# Patient Record
Sex: Male | Born: 1935 | Race: White | Hispanic: No | Smoking: Former smoker
Health system: Southern US, Community
[De-identification: ages and names within clinical notes are randomized; demographics above are authoritative.]

## PROBLEM LIST (undated history)

## (undated) DIAGNOSIS — M1990 Unspecified osteoarthritis, unspecified site: Secondary | ICD-10-CM

## (undated) DIAGNOSIS — E785 Hyperlipidemia, unspecified: Secondary | ICD-10-CM

## (undated) DIAGNOSIS — E119 Type 2 diabetes mellitus without complications: Secondary | ICD-10-CM

## (undated) DIAGNOSIS — K219 Gastro-esophageal reflux disease without esophagitis: Secondary | ICD-10-CM

## (undated) DIAGNOSIS — F0390 Unspecified dementia without behavioral disturbance: Secondary | ICD-10-CM

## (undated) DIAGNOSIS — I4891 Unspecified atrial fibrillation: Secondary | ICD-10-CM

## (undated) DIAGNOSIS — I10 Essential (primary) hypertension: Secondary | ICD-10-CM

## (undated) HISTORY — PX: OTHER SURGICAL HISTORY: SHX169

## (undated) HISTORY — DX: Unspecified osteoarthritis, unspecified site: M19.90

## (undated) HISTORY — DX: Gastro-esophageal reflux disease without esophagitis: K21.9

## (undated) HISTORY — PX: CARDIAC SURGERY: SHX584

---

## 1999-10-05 ENCOUNTER — Encounter: Payer: Self-pay | Admitting: Cardiothoracic Surgery

## 1999-10-06 ENCOUNTER — Inpatient Hospital Stay (HOSPITAL_COMMUNITY): Admission: RE | Admit: 1999-10-06 | Discharge: 1999-10-12 | Payer: Self-pay | Admitting: Cardiothoracic Surgery

## 1999-10-06 ENCOUNTER — Encounter: Payer: Self-pay | Admitting: Cardiothoracic Surgery

## 1999-10-07 ENCOUNTER — Encounter: Payer: Self-pay | Admitting: Cardiothoracic Surgery

## 1999-10-08 ENCOUNTER — Encounter: Payer: Self-pay | Admitting: Cardiothoracic Surgery

## 1999-10-09 ENCOUNTER — Encounter: Payer: Self-pay | Admitting: Cardiothoracic Surgery

## 1999-10-10 ENCOUNTER — Encounter: Payer: Self-pay | Admitting: Cardiothoracic Surgery

## 1999-10-11 ENCOUNTER — Encounter: Payer: Self-pay | Admitting: Cardiothoracic Surgery

## 1999-10-12 ENCOUNTER — Encounter: Payer: Self-pay | Admitting: Cardiothoracic Surgery

## 2006-01-26 ENCOUNTER — Ambulatory Visit: Payer: Self-pay | Admitting: *Deleted

## 2006-02-24 ENCOUNTER — Ambulatory Visit: Payer: Self-pay | Admitting: *Deleted

## 2006-03-04 ENCOUNTER — Ambulatory Visit: Payer: Self-pay | Admitting: *Deleted

## 2006-03-08 ENCOUNTER — Ambulatory Visit: Payer: Self-pay | Admitting: *Deleted

## 2011-04-14 ENCOUNTER — Observation Stay: Payer: Self-pay | Admitting: Internal Medicine

## 2011-04-14 LAB — URINALYSIS, COMPLETE
Blood: NEGATIVE
Ketone: NEGATIVE
Ph: 5 (ref 4.5–8.0)
Protein: NEGATIVE
Specific Gravity: 1.01 (ref 1.003–1.030)
WBC UR: NONE SEEN /HPF (ref 0–5)

## 2011-04-14 LAB — TSH: Thyroid Stimulating Horm: 0.97 u[IU]/mL

## 2011-04-14 LAB — COMPREHENSIVE METABOLIC PANEL
Albumin: 3.8 g/dL (ref 3.4–5.0)
Alkaline Phosphatase: 39 U/L — ABNORMAL LOW (ref 50–136)
Anion Gap: 12 (ref 7–16)
BUN: 32 mg/dL — ABNORMAL HIGH (ref 7–18)
Bilirubin,Total: 0.6 mg/dL (ref 0.2–1.0)
Calcium, Total: 9.3 mg/dL (ref 8.5–10.1)
Chloride: 104 mmol/L (ref 98–107)
Co2: 24 mmol/L (ref 21–32)
Creatinine: 1.08 mg/dL (ref 0.60–1.30)
EGFR (African American): >60
EGFR (Non-African Amer.): >60
Glucose: 296 mg/dL — ABNORMAL HIGH (ref 65–99)
Osmolality: 297 (ref 275–301)
Potassium: 4.5 mmol/L (ref 3.5–5.1)
SGOT(AST): 26 U/L (ref 15–37)
SGPT (ALT): 23 U/L
Sodium: 140 mmol/L (ref 136–145)
Total Protein: 7.4 g/dL (ref 6.4–8.2)

## 2011-04-14 LAB — CBC
HCT: 41.7 % (ref 40.0–52.0)
HGB: 14.3 g/dL (ref 13.0–18.0)
MCH: 34.6 pg — ABNORMAL HIGH (ref 26.0–34.0)
MCHC: 34.3 g/dL (ref 32.0–36.0)
MCV: 101 fL — ABNORMAL HIGH (ref 80–100)
Platelet: 191 10*3/uL (ref 150–440)
RBC: 4.14 10*6/uL — ABNORMAL LOW (ref 4.40–5.90)
RDW: 12.9 % (ref 11.5–14.5)
WBC: 9.8 10*3/uL (ref 3.8–10.6)

## 2011-04-14 LAB — CK TOTAL AND CKMB (NOT AT ARMC)
CK, Total: 129 U/L (ref 35–232)
CK-MB: 3.4 ng/mL (ref 0.5–3.6)

## 2011-04-14 LAB — TROPONIN I: Troponin-I: 0.02 ng/mL

## 2011-04-15 LAB — BASIC METABOLIC PANEL
Anion Gap: 10 (ref 7–16)
BUN: 24 mg/dL — ABNORMAL HIGH (ref 7–18)
Calcium, Total: 9 mg/dL (ref 8.5–10.1)
Chloride: 111 mmol/L — ABNORMAL HIGH (ref 98–107)
Creatinine: 0.88 mg/dL (ref 0.60–1.30)
EGFR (African American): >60
Glucose: 125 mg/dL — ABNORMAL HIGH (ref 65–99)

## 2011-04-15 LAB — CBC WITH DIFFERENTIAL/PLATELET
Basophil #: 0 10*3/uL (ref 0.0–0.1)
Eosinophil #: 0.1 10*3/uL (ref 0.0–0.7)
Eosinophil %: 1.6 %
HCT: 38.9 % — ABNORMAL LOW (ref 40.0–52.0)
HGB: 13.1 g/dL (ref 13.0–18.0)
Lymphocyte %: 12 %
MCHC: 33.7 g/dL (ref 32.0–36.0)
Neutrophil #: 6.6 10*3/uL — ABNORMAL HIGH (ref 1.4–6.5)
Neutrophil %: 76.7 %
Platelet: 184 10*3/uL (ref 150–440)
RDW: 12.6 % (ref 11.5–14.5)

## 2011-04-15 LAB — TROPONIN I: Troponin-I: 0.03 ng/mL

## 2011-04-15 LAB — CK TOTAL AND CKMB (NOT AT ARMC)
CK, Total: 121 U/L (ref 35–232)
CK-MB: 3.4 ng/mL (ref 0.5–3.6)

## 2014-06-09 NOTE — Consult Note (Signed)
PATIENT NAME:  Evan Vasquez, Evan Vasquez MR#:  [id number] DATE OF BIRTH:  Dec 24, 1917  DATE OF CONSULTATION:  04/15/2011  REFERRING PHYSICIAN:   CONSULTING PHYSICIAN:  Marcina MillardAlexander Elif Yonts, MD  PRIMARY CARE PHYSICIAN:  Dr. Harrington Challengerhies.   CHIEF COMPLAINT: Irregular heart rhythm.   HISTORY OF PRESENT ILLNESS: The patient is a 81 year old gentleman with known coronary artery disease status post prior bypass graft surgery. The patient reports a history of atrial fibrillation approximately three years ago. He now complains of a 1 to 2 day history of lightheadedness, dizziness, near syncope and frequent falling. He was evaluated in [facility] Emergency Room where he was noted to be in atrial fibrillation with rapid ventricular response. The patient was admitted to telemetry and has converted to sinus rhythm. Admission labs were notable for a negative troponin of 0.02. The patient currently is asymptomatic. Denies chest pain or shortness of breath.   PAST MEDICAL HISTORY:  1. Status post coronary artery bypass graft [facility]. 2. History of atrial fibrillation 2 to 3 years ago, followed by Dr. Lady GaryFath.  3. Type 2 diabetes.  4. Hypertension.   MEDICATIONS ON ADMISSION:  1. Aspirin 81 mg daily.  2. Pletal 50 mg b.i.d.  3. Lopressor 25 mg b.i.d.  4. Altace 5 mg daily.  5. Amaryl 4 mg daily.   SOCIAL HISTORY: The patient is married and lives with his wife. He denies tobacco or EtOH abuse.   FAMILY HISTORY: Positive for coronary artery disease.   REVIEW OF SYSTEMS: CONSTITUTIONAL: No fever or chills. EYES: No blurry vision. EARS: No hearing loss. GASTROINTESTINAL: No nausea, vomiting, diarrhea, or constipation. GU: No dysuria or hematuria. ENDOCRINE: No polyuria or polydipsia. MUSCULOSKELETAL: No arthralgias or myalgias. NEUROLOGICAL: No focal muscle weakness or numbness. PSYCHOLOGICAL: No anxiety or depression.   PHYSICAL EXAMINATION:  VITAL SIGNS: Blood pressure  134/74, pulse 82, respirations 18, temperature 96.9, pulse oximetry 98%.   HEENT: Pupils equal and reactive to light and accommodation.   NECK: Supple without thyromegaly.   LUNGS: Clear.   HEART: Normal jugular venous pressure. Normal point of maximal impulse. Irregularly irregular rhythm. Normal S1, S2. No appreciable gallop, murmur, or rub.   ABDOMEN: Soft and nontender. Pulses were intact bilaterally.   MUSCULOSKELETAL: Normal muscle tone.   NEUROLOGIC: The patient is alert and oriented x3. Motor and sensory both grossly intact.   IMPRESSION: A 81 year old gentleman with atrial fibrillation now converted to sinus rhythm with known history of coronary artery disease. The patient has a CHADS2 score of 3. However, he is advanced in years and has had a recent history of falls, though it is unclear whether it was due to paroxysmal atrial fibrillation.   RECOMMENDATIONS:  1. Agree with overall current therapy.  2. Review 2-D echocardiogram.  3. The patient has a CHADS2 score of 3 and would normally be a candidate for chronic anticoagulation with warfarin or one of the newer agents. However, the patient has been falling and it is unclear whether it is actually due to paroxysmal atrial fibrillation or just to          advanced age. I certainly would continue aspirin. Would consider chronic anticoagulation if       the patient can demonstrate ambulation without difficulty.    ____________________________ Marcina MillardAlexander Dolly Harbach, MD ap:ap D: 04/15/2011 13:24:35 ET T: 04/15/2011 15:07:25 ET JOB#: [id number]  cc: Marcina MillardAlexander Ashliegh Parekh, MD, <Dictator> Marcina MillardALEXANDER Andrez Lieurance MD ELECTRONICALLY SIGNED 04/22/2011 13:10

## 2014-06-09 NOTE — Discharge Summary (Signed)
PATIENT NAME:  Evan Vasquez, Evan Vasquez MR#:  [id number] DATE OF BIRTH:  [birth year]  DATE OF ADMISSION:  04/14/2011 DATE OF DISCHARGE:  04/16/2011  DISCHARGE DIAGNOSES:  1. Atrial fibrillation. Currently the patient is in normal sinus rhythm. 2. Presyncope possibly due to atrial fibrillation with RVR. 3. Diabetes. 4. Coronary artery disease, status post coronary artery bypass graft. 5. Hypertension.   DISPOSITION: The patient is being discharged home. He refused home health and physical therapy.   DIET: Low sodium.   FOLLOW-UP: Follow-up with primary care physician, Dr. Mickey Farberavid Thies, in 1 to 2 weeks after discharge.   DIET: Low sodium.   ACTIVITY: As tolerated.   DISCHARGE MEDICATIONS:  1. Lopressor 50 mg b.i.d. 2. Ramipril 5 mg daily.  3. Amaryl 4 mg daily.  4. Aspirin 81 mg daily.  5. Cilostazol 50 mg b.i.d.   CONSULTATION: Cardiology consultation with Dr. Lady GaryFath   LABORATORY, DIAGNOSTIC, AND RADIOLOGICAL DATA: Chest x-ray showed no acute abnormalities. Echo showed normal left ventricular function, normal EF, mild LVH. CBC essentially normal. CMP normal. Cardiac enzymes negative. TSH normal.   HOSPITAL COURSE: The patient is a 81 year old male with past medical history of hypertension, diabetes, coronary artery disease status post coronary artery bypass graft who presented with dizziness and presyncope. He was found to have atrial fibrillation with RVR on presentation. During the hospitalization, he spontaneously converted to normal sinus rhythm and has remained so. His heart rate has been under good control. Echo showed normal EF. His TSH was normal. His cardiac enzymes were also negative. He was evaluated by Dr. Darrold JunkerParaschos during the hospitalization who felt it was best to manage him medically. His [place] score was high but he was also high fall risk given his age and it was felt best not to start the patient on any Coumadin anticoagulation. He will continue on aspirin. The rest of the patient's  medical problems remained stable during the hospitalization. He was ambulating without any difficulty. Home health and PT were offered to the patient but he refused. The patient is being discharged home in a stable condition.   TIME SPENT: 45 minutes.   ____________________________ Darrick MeigsSangeeta Raekwon Winkowski, MD sp:drc D: 04/16/2011 16:13:35 ET T: 04/17/2011 08:36:42 ET JOB#: [id number]  cc: Darrick MeigsSangeeta Lowen Barringer, MD, <Dictator> Neomia Dearavid N. Harrington Challengerhies, MD Darrick MeigsSANGEETA Shloma Roggenkamp MD ELECTRONICALLY SIGNED 04/17/2011 18:39

## 2014-06-09 NOTE — H&P (Signed)
PATIENT NAME:  Evan Vasquez, Evan Vasquez MR#:  [id number] DATE OF BIRTH:  [birth year]  DATE OF ADMISSION:  04/14/2011  REFERRING PHYSICIAN: Dr. Glenetta Hew.   FAMILY PHYSICIAN: Dr. Harrington Challenger   REASON FOR ADMISSION: Rapid atrial fibrillation with near syncope.   HISTORY OF PRESENT ILLNESS: The patient is a 81 year old male with history of coronary artery disease status post CABG and diabetes who presents to the Emergency Room with a one to two day history of near syncope, dizziness, and frequent falls. In the Emergency Room, the patient was found to be in rapid atrial fibrillation. Denies chest pain or shortness of breath. He is now admitted for further evaluation.   PAST MEDICAL HISTORY:  1. Atherosclerotic cardiovascular disease status post coronary artery bypass graft.  2. Questionable history of atrial fibrillation in the past.  3. Benign hypertension.  4. Type II diabetes mellitus. 5. Status post inguinal hernia repair.   MEDICATIONS:  1. Altace 5 mg p.o. daily.  2. Lopressor 25 mg p.o. b.i.d.  3. Amaryl 4 mg p.o. daily.  4. Pletal 50 mg p.o. b.i.d.  5. Aspirin 81 mg p.o. daily.   ALLERGIES: No known drug allergies.   SOCIAL HISTORY: The patient is married, lives with his wife. No history of alcohol or tobacco abuse.   FAMILY HISTORY: Positive for hypertension, diabetes, stroke, and coronary artery disease.   REVIEW OF SYSTEMS: CONSTITUTIONAL: No fever or change in weight. EYES: No blurred or double vision. No glaucoma. ENT: No tinnitus or hearing loss. No nasal discharge or bleeding. No difficulty swallowing. RESPIRATORY: No cough or wheezing. No painful respiration. CARDIOVASCULAR: No chest pain or orthopnea. No syncope. GI: No nausea, vomiting, or diarrhea. No abdominal pain. No change in bowel habits. GU: No dysuria or hematuria. No incontinence. ENDOCRINE: No polyuria or polydipsia. No heat or cold intolerance. HEMATOLOGIC: The patient denies anemia, easy bruising, or bleeding. LYMPHATIC: No  swollen glands. MUSCULOSKELETAL: The patient denies pain in his neck, back, shoulders, knees, or hips. No gout. NEUROLOGIC: No numbness or migraines. Denies stroke or seizures. Does have generalized weakness. PSYCH: The patient denies anxiety, insomnia, or depression.  PHYSICAL EXAMINATION:  GENERAL: The patient is elderly in no acute distress.  VITAL SIGNS: Blood pressure 134/65 with a heart rate of 120 which is irregular, and a respiratory rate of 16. He is afebrile.  HEENT: Normocephalic and atraumatic. Pupils equally round and reactive to light and accommodation. Extraocular movements are intact. Sclerae are nonicteric. Conjunctivae are clear. Oropharynx is clear.   NECK: Supple without JVD. No adenopathy or thyromegaly is noted.   LUNGS: Clear to auscultation and percussion without wheezes, rales, or rhonchi. No dullness.  CARDIAC: Irregularly irregular rhythm with rapid rate. No significant rubs or gallops.  ABDOMEN: Soft, nontender with normoactive bowel sounds. No organomegaly or mass were appreciated. No hernias or bruits were noted.  EXTREMITIES: Without clubbing or cyanosis. Trace edema was noted bilaterally. Pulses were 1+ bilaterally.  SKIN: Warm and dry without rash or lesions.   NEUROLOGIC: Cranial nerves II through XII grossly intact. Deep tendon reflexes were symmetric. Motor and sensory exams nonfocal.   PSYCH: Alert and oriented to person, place, and time. Appears cooperative and used good judgment.   LABORATORY DATA: EKG revealed atrial fibrillation at a rate of 116 beats per minute with no acute ischemic changes. Chest x-ray was unremarkable. CBC was within normal limits. Urinalysis was negative. Troponin was 0.02. Glucose 296 with a BUN of 32 and a creatinine of 1.08 with a sodium of  140 and a potassium of 4.5.   ASSESSMENT:  1. Rapid atrial fibrillation.  2. Near syncope with dizziness.  3. Type II diabetes mellitus.  4. Atherosclerotic cardiovascular disease  status post coronary artery bypass grafting. 5. Benign hypertension, stable.   PLAN:  1. The patient will be admitted to telemetry with aspirin, Lovenox, and Lopressor q.6 hours.  2. Will continue his Pletal.  3. Will check his thyroid and magnesium status at this time.  4. Will follow serial cardiac enzymes and obtain an echocardiogram.  5. Will consult Cardiology because of his recurrent atrial fibrillation with rapid ventricular response.  6. Will continue his other outpatient medications.  7. Will follow his sugars with Accu-Cheks q.a.c. and at bedtime and add sliding scale insulin as needed.  8. Follow-up routine labs in the morning.  9. Further treatment and evaluation will depend upon the patient's progress.   TOTAL TIME SPENT ON THIS PATIENT: 50 minutes.   ____________________________ Duane LopeJeffrey D. Judithann SheenSparks, MD jds:drc D: 04/14/2011 20:34:42 ET T: 04/15/2011 06:11:00 ET JOB#: [id number]  cc: Duane LopeJeffrey D. Judithann SheenSparks, MD, <Dictator>, Neomia Dearavid N. Harrington Challengerhies, MD Malarie Tappen Rodena Medin Nguyen Todorov MD ELECTRONICALLY SIGNED 04/16/2011 19:26

## 2014-06-09 NOTE — Consult Note (Signed)
Brief Consult Note: Diagnosis: AF, now in sinus. CHADS2 of 3, however patient at increased risk for falling.   Patient was seen by consultant.   Consult note dictated.   Comments: REC  Agree with current therapy, review echo, cont ASA, hesitant to start warfarin due to advnaced age and risk of falling.  Electronic Signatures: Marcina MillardParaschos, Khila Papp (MD)  (Signed [phone] [id number]-Feb-13 13:26)  Authored: Brief Consult Note   Last Updated: 28-Feb-13 13:26 by Marcina MillardParaschos, Timmie Calix (MD)

## 2015-02-19 ENCOUNTER — Inpatient Hospital Stay
Admission: EM | Admit: 2015-02-19 | Discharge: 2015-02-21 | DRG: 871 | Disposition: A | Discharge status: Home Health | Payer: Medicare Other | Attending: Specialist | Admitting: Specialist

## 2015-02-19 ENCOUNTER — Emergency Department: Payer: Medicare Other

## 2015-02-19 ENCOUNTER — Encounter: Payer: Self-pay | Admitting: Internal Medicine

## 2015-02-19 DIAGNOSIS — L8990 Pressure ulcer of unspecified site, unspecified stage: Secondary | ICD-10-CM | POA: Diagnosis present

## 2015-02-19 DIAGNOSIS — J189 Pneumonia, unspecified organism: Secondary | ICD-10-CM | POA: Diagnosis present

## 2015-02-19 DIAGNOSIS — I10 Essential (primary) hypertension: Secondary | ICD-10-CM | POA: Diagnosis present

## 2015-02-19 DIAGNOSIS — E119 Type 2 diabetes mellitus without complications: Secondary | ICD-10-CM | POA: Diagnosis present

## 2015-02-19 DIAGNOSIS — Y95 Nosocomial condition: Secondary | ICD-10-CM | POA: Diagnosis present

## 2015-02-19 DIAGNOSIS — R197 Diarrhea, unspecified: Secondary | ICD-10-CM | POA: Diagnosis present

## 2015-02-19 DIAGNOSIS — I482 Chronic atrial fibrillation: Secondary | ICD-10-CM | POA: Diagnosis present

## 2015-02-19 DIAGNOSIS — Z79899 Other long term (current) drug therapy: Secondary | ICD-10-CM | POA: Diagnosis not present

## 2015-02-19 DIAGNOSIS — F0390 Unspecified dementia without behavioral disturbance: Secondary | ICD-10-CM | POA: Diagnosis present

## 2015-02-19 DIAGNOSIS — E785 Hyperlipidemia, unspecified: Secondary | ICD-10-CM | POA: Diagnosis present

## 2015-02-19 DIAGNOSIS — A419 Sepsis, unspecified organism: Principal | ICD-10-CM | POA: Diagnosis present

## 2015-02-19 DIAGNOSIS — E871 Hypo-osmolality and hyponatremia: Secondary | ICD-10-CM | POA: Diagnosis present

## 2015-02-19 DIAGNOSIS — Z7982 Long term (current) use of aspirin: Secondary | ICD-10-CM

## 2015-02-19 DIAGNOSIS — G9341 Metabolic encephalopathy: Secondary | ICD-10-CM | POA: Diagnosis present

## 2015-02-19 DIAGNOSIS — L89302 Pressure ulcer of unspecified buttock, stage 2: Secondary | ICD-10-CM | POA: Diagnosis present

## 2015-02-19 DIAGNOSIS — I248 Other forms of acute ischemic heart disease: Secondary | ICD-10-CM | POA: Diagnosis present

## 2015-02-19 DIAGNOSIS — I739 Peripheral vascular disease, unspecified: Secondary | ICD-10-CM | POA: Diagnosis present

## 2015-02-19 DIAGNOSIS — Z951 Presence of aortocoronary bypass graft: Secondary | ICD-10-CM | POA: Diagnosis not present

## 2015-02-19 DIAGNOSIS — R778 Other specified abnormalities of plasma proteins: Secondary | ICD-10-CM

## 2015-02-19 DIAGNOSIS — R7989 Other specified abnormal findings of blood chemistry: Secondary | ICD-10-CM

## 2015-02-19 HISTORY — DX: Hyperlipidemia, unspecified: E78.5

## 2015-02-19 HISTORY — DX: Unspecified dementia, unspecified severity, without behavioral disturbance, psychotic disturbance, mood disturbance, and anxiety: F03.90

## 2015-02-19 HISTORY — DX: Unspecified atrial fibrillation: I48.91

## 2015-02-19 HISTORY — DX: Essential (primary) hypertension: I10

## 2015-02-19 HISTORY — DX: Type 2 diabetes mellitus without complications: E11.9

## 2015-02-19 LAB — COMPREHENSIVE METABOLIC PANEL
ALK PHOS: 61 U/L (ref 38–126)
ALT: 26 U/L (ref 17–63)
ANION GAP: 11 (ref 5–15)
AST: 36 U/L (ref 15–41)
Albumin: 4.3 g/dL (ref 3.5–5.0)
BILIRUBIN TOTAL: 1.1 mg/dL (ref 0.3–1.2)
BUN: 24 mg/dL — ABNORMAL HIGH (ref 6–20)
CO2: 22 mmol/L (ref 22–32)
CREATININE: 0.95 mg/dL (ref 0.61–1.24)
Calcium: 8.7 mg/dL — ABNORMAL LOW (ref 8.9–10.3)
Chloride: 95 mmol/L — ABNORMAL LOW (ref 101–111)
GLUCOSE: 119 mg/dL — AB (ref 65–99)
Potassium: 4.5 mmol/L (ref 3.5–5.1)
Sodium: 128 mmol/L — ABNORMAL LOW (ref 135–145)
TOTAL PROTEIN: 7.3 g/dL (ref 6.5–8.1)

## 2015-02-19 LAB — URINALYSIS COMPLETE WITH MICROSCOPIC (ARMC ONLY)
BILIRUBIN URINE: NEGATIVE
Bacteria, UA: NONE SEEN
GLUCOSE, UA: NEGATIVE mg/dL
HGB URINE DIPSTICK: NEGATIVE
LEUKOCYTES UA: NEGATIVE
Nitrite: NEGATIVE
Protein, ur: 30 mg/dL — AB
SQUAMOUS EPITHELIAL / LPF: NONE SEEN
Specific Gravity, Urine: 1.017 (ref 1.005–1.030)
pH: 5 (ref 5.0–8.0)

## 2015-02-19 LAB — CBC
HEMATOCRIT: 40.7 % (ref 40.0–52.0)
Hemoglobin: 13.8 g/dL (ref 13.0–18.0)
MCH: 32.7 pg (ref 26.0–34.0)
MCHC: 33.9 g/dL (ref 32.0–36.0)
MCV: 96.6 fL (ref 80.0–100.0)
Platelets: 142 10*3/uL — ABNORMAL LOW (ref 150–440)
RBC: 4.21 MIL/uL — ABNORMAL LOW (ref 4.40–5.90)
RDW: 12.4 % (ref 11.5–14.5)
WBC: 8.8 10*3/uL (ref 3.8–10.6)

## 2015-02-19 LAB — TROPONIN I: Troponin I: 0.05 ng/mL — ABNORMAL HIGH (ref <0.031)

## 2015-02-19 LAB — LACTIC ACID, PLASMA: Lactic Acid, Venous: 1.9 mmol/L (ref 0.5–2.0)

## 2015-02-19 MED ORDER — ENOXAPARIN SODIUM 60 MG/0.6ML ~~LOC~~ SOLN
1.0000 mg/kg | Freq: Two times a day (BID) | SUBCUTANEOUS | Status: DC
Start: 2015-02-20 — End: 2015-02-20
  Administered 2015-02-20 (×2): 60 mg via SUBCUTANEOUS
  Filled 2015-02-19 (×3): qty 0.6

## 2015-02-19 MED ORDER — ACETAMINOPHEN 500 MG PO TABS
1000.0000 mg | ORAL_TABLET | Freq: Once | ORAL | Status: AC
  Administered 2015-02-19: 1000 mg via ORAL
  Filled 2015-02-19: qty 2

## 2015-02-19 MED ORDER — ONDANSETRON HCL 4 MG/2ML IJ SOLN: 4.0000 mg | Freq: Four times a day (QID) | INTRAMUSCULAR | Status: DC | PRN

## 2015-02-19 MED ORDER — VANCOMYCIN HCL IN DEXTROSE 1-5 GM/200ML-% IV SOLN
1000.0000 mg | Freq: Once | INTRAVENOUS | Status: AC
  Administered 2015-02-19: 1000 mg via INTRAVENOUS
  Filled 2015-02-19: qty 200

## 2015-02-19 MED ORDER — ACETAMINOPHEN 325 MG PO TABS
650.0000 mg | ORAL_TABLET | Freq: Four times a day (QID) | ORAL | Status: DC | PRN
Start: 2015-02-19 — End: 2015-02-21

## 2015-02-19 MED ORDER — PIPERACILLIN-TAZOBACTAM 3.375 G IVPB
3.3750 g | Freq: Once | INTRAVENOUS | Status: AC
  Administered 2015-02-19: 3.375 g via INTRAVENOUS
  Filled 2015-02-19: qty 50

## 2015-02-19 MED ORDER — SODIUM CHLORIDE 0.9 % IV BOLUS (SEPSIS)
1000.0000 mL | Freq: Once | INTRAVENOUS | Status: AC
  Administered 2015-02-19: 1000 mL via INTRAVENOUS

## 2015-02-19 NOTE — ED Provider Notes (Signed)
[facility] Emergency Department Provider Note  ____________________________________________  Time seen: Approximately 10:01 PM  I have reviewed the triage vital signs and the nursing notes.   HISTORY  Chief Complaint Altered Mental Status and Code Sepsis    HPI Evan Vasquez is a 82 y.o. male nursing home patient with a history of A. fib presenting with cough, and altered mental status. Patient reports that he has had a productive cough for the past 2 weeks. His daughter reports that he has been feeling poorly and today did not want to leave his room to go eat lunch. He is usually "sharp as attack" but over the last few days has been unable to answer basic questions and perform basic ADLs. The patient denies any nausea or vomiting, shortness of breath, chest pain, diarrhea and denies burning with urination but states he has found it difficult to urinate.   No past medical history on file.  There are no active problems to display for this patient.   No past surgical history on file.  Current Outpatient Rx  Name  Route  Sig  Dispense  Refill  . aspirin EC 81 MG tablet   Oral   Take 81 mg by mouth daily.         Marland Kitchen. atorvastatin (LIPITOR) 10 MG tablet   Oral   Take 10 mg by mouth daily.         Marland Kitchen. azelastine (ASTELIN) 0.1 % nasal spray   Each Nare   Place 1 spray into both nostrils 2 (two) times daily. Use in each nostril as directed         . cilostazol (PLETAL) 50 MG tablet   Oral   Take 50 mg by mouth 2 (two) times daily.         . fluticasone (FLONASE) 50 MCG/ACT nasal spray   Each Nare   Place 1 spray into both nostrils daily.         Marland Kitchen. glimepiride (AMARYL) 4 MG tablet   Oral   Take 4 mg by mouth every morning.         . metoprolol tartrate (LOPRESSOR) 25 MG tablet   Oral   Take 25 mg by mouth 2 (two) times daily.         . ramipril (ALTACE) 2.5 MG capsule   Oral   Take 2.5 mg by mouth daily.            Allergies Review of patient's allergies indicates no known allergies.  No family history on file.  Social History Social History  Substance Use Topics  . Smoking status: Not on file  . Smokeless tobacco: Not on file  . Alcohol Use: Not on file    Review of Systems Constitutional: Positive fever. Positive altered mental status. No syncope. Eyes: No visual changes. No eye discharge. ENT: No sore throat. Cardiovascular: Denies chest pain, palpitations. Respiratory: Denies shortness of breath.  Positive productive cough. Gastrointestinal: No abdominal pain.  No nausea, no vomiting.  No diarrhea.  No constipation. Genitourinary: Negative for dysuria positive for difficulty with urination. Musculoskeletal: Negative for back pain. Skin: Negative for rash. Neurological: Negative for headaches, focal weakness or numbness.  10-point ROS otherwise negative.  ____________________________________________   PHYSICAL EXAM:  VITAL SIGNS: ED Triage Vitals  Enc Vitals Group     BP 02/19/15 2135 174/79 mmHg     Pulse Rate 02/19/15 2135 105     Resp 02/19/15 2135 21     Temp  02/19/15 2135 101.7 F (38.7 C)     Temp Source 02/19/15 2135 Rectal     SpO2 02/19/15 2122 96 %     Weight --      Height --      Head Cir --      Peak Flow --      Pain Score --      Pain Loc --      Pain Edu? --      Excl. in GC? --     Constitutional: Patient is alert and oriented and answering most questions appropriately.  Eyes: Conjunctivae are normal.  EOMI. no discharge. Head: Atraumatic. Nose: No congestion/rhinnorhea. Mouth/Throat: Mucous membranes are moist.  Neck: No stridor.  Supple.  No neck stiffness. No meningismus. Cardiovascular: Fast rate, irregular rhythm. No murmurs, rubs or gallops.  Respiratory: Patient is tachypnea but able to speak in full sentences. He has minimal accessory muscle use with no retractions. His lungs are clear to auscultation with the exception of the right  lower lung base which has some crackles. No wheezes. O2 sats on room air 95%. Gastrointestinal: Soft and nontender. No distention. No peritoneal signs. Musculoskeletal: No LE edema.  Neurologic:  Patient is alert and oriented to person only. He thinks since the 1990s and December. He does know the president. His face is symmetric and his smile is symmetric. EOMI and PERRLA. Moves all extremities well. Has a baseline resting tremor.  Skin:  Skin is warm, dry and intact. No rash noted. Psychiatric: Mood and affect are normal. Speech and behavior are normal.  Normal judgement  ____________________________________________   LABS (all labs ordered are listed, but only abnormal results are displayed)  Labs Reviewed  CBC - Abnormal; Notable for the following:    RBC 4.21 (*)    Platelets 142 (*)    All other components within normal limits  COMPREHENSIVE METABOLIC PANEL - Abnormal; Notable for the following:    Sodium 128 (*)    Chloride 95 (*)    Glucose, Bld 119 (*)    BUN 24 (*)    Calcium 8.7 (*)    All other components within normal limits  TROPONIN I - Abnormal; Notable for the following:    Troponin I 0.05 (*)    All other components within normal limits  URINALYSIS COMPLETEWITH MICROSCOPIC ([facility] ONLY) - Abnormal; Notable for the following:    Color, Urine YELLOW (*)    APPearance CLEAR (*)    Ketones, ur 1+ (*)    Protein, ur 30 (*)    All other components within normal limits  BLOOD GAS, VENOUS - Abnormal; Notable for the following:    pCO2, Ven 38 (*)    Acid-base deficit 2.3 (*)    All other components within normal limits  CULTURE, BLOOD (ROUTINE X 2)  CULTURE, BLOOD (ROUTINE X 2)  URINE CULTURE  LACTIC ACID, PLASMA  LACTIC ACID, PLASMA   ____________________________________________  EKG  ED ECG REPORT I, Rockne Menghini, the attending physician, personally viewed and interpreted this ECG.   Date: 02/19/2015  EKG Time: 2131  Rate: 103  Rhythm: atrial  fibrillation,   Axis: Normal  Intervals:none  ST&T Change: No ST elevation. No ischemic changes.  ____________________________________________  RADIOLOGY  Dg Chest 2 View  02/19/2015  CLINICAL DATA:  Acute onset of altered mental status. Confusion. Cough. Initial encounter. EXAM: CHEST  2 VIEW COMPARISON:  Chest radiograph performed 04/14/2011 FINDINGS: The lungs are well-aerated. Vascular congestion is noted. Minimally increased  interstitial markings could reflect minimal interstitial edema. There is no evidence of pleural effusion or pneumothorax. The heart is borderline enlarged. The patient is status post median sternotomy, with evidence of prior CABG. No acute osseous abnormalities are seen. A moderate to large hiatal hernia is noted, partially filled with fluid and air. IMPRESSION: 1. Vascular congestion and borderline cardiomegaly. Mildly increased interstitial markings could reflect minimal interstitial edema. 2. Moderate to large hiatal hernia, partially filled with fluid and air. Electronically Signed   By: Roanna Raider M.D.   On: 02/19/2015 23:16   Ct Head Wo Contrast  02/19/2015  CLINICAL DATA:  Acute onset of confusion. Altered mental status. Initial encounter. EXAM: CT HEAD WITHOUT CONTRAST TECHNIQUE: Contiguous axial images were obtained from the base of the skull through the vertex without intravenous contrast. COMPARISON:  None. FINDINGS: There is no evidence of acute infarction, mass lesion, or intra- or extra-axial hemorrhage on CT. Prominence of the ventricles and sulci reflects mild to moderate cortical volume loss. Mild cerebellar atrophy is noted. Scattered periventricular and subcortical white matter change likely reflects small vessel ischemic microangiopathy. Small chronic lacunar infarcts are noted at the left basal ganglia. The brainstem and fourth ventricle are within normal limits. The cerebral hemispheres demonstrate grossly normal gray-white differentiation. No mass  effect or midline shift is seen. There is no evidence of fracture; visualized osseous structures are unremarkable in appearance. The visualized portions of the orbits are within normal limits. The paranasal sinuses and mastoid air cells are well-aerated. No significant soft tissue abnormalities are seen. IMPRESSION: 1. No acute intracranial pathology seen on CT. 2. Mild to moderate cortical volume loss and scattered small vessel ischemic microangiopathy. 3. Small chronic lacunar infarcts at the left basal ganglia. Electronically Signed   By: Roanna Raider M.D.   On: 02/19/2015 23:20    ____________________________________________   PROCEDURES  Procedure(s) performed: None  Critical Care performed: No ____________________________________________   INITIAL IMPRESSION / ASSESSMENT AND PLAN / ED COURSE  Pertinent labs & imaging results that were available during my care of the patient were reviewed by me and considered in my medical decision making (see chart for details).  82 y.o. male with 2 weeks of cough now with altered mental status and fever with tachycardia. The patient has A. fib with tachycardia. I am concerned that the patient may have a pneumonia or other infection including urine or blood. I'll plan to treat him empirically with broad-spectrum antibiotics and initiate IV fluids. He will be admitted to the hospital.  ____________________________________________  FINAL CLINICAL IMPRESSION(S) / ED DIAGNOSES  Final diagnoses:  Hyponatremia  Sepsis, due to unspecified organism (HCC)  Elevated troponin  Healthcare-associated pneumonia      NEW MEDICATIONS STARTED DURING THIS VISIT:  New Prescriptions   No medications on file     Rockne Menghini, MD 02/19/15 2330

## 2015-02-19 NOTE — H&P (Addendum)
[facility] - [facility] at [facility]   PATIENT NAME: Evan Vasquez    MR#:  [id number]  DATE OF BIRTH:  Jul 30, 1917  DATE OF ADMISSION:  02/19/2015  PRIMARY CARE PHYSICIAN: No primary care provider on file.   REQUESTING/REFERRING PHYSICIAN:   CHIEF COMPLAINT:   Chief Complaint  Patient presents with  . Altered Mental Status  . Code Sepsis    HISTORY OF PRESENT ILLNESS: Evan Vasquez  is a 82 y.o. male with a known history of diabetes mellitus, hypertension, hyperlipidemia, chronic atrial fibrillation is a resident of nursing home presented to the emergency room with change in mental status and confusion. Patient was lethargic with decreased responsiveness and hence was transferred.He also had some occasional cough. Not much history could be obtained from the patient. He is awake and responds to verbal commands but also has some dementia. Emergency room chart was reviewed. Patient was worked up with a CT head which showed old lacunar infarcts. Laboratory workup showed low sodium level and the patient was given IV antibiotics for pneumonia and possible early sepsis. Patient had fever during the presentation. Does not use any oxygen at nursing home. His heart rate was under control. Not a great historian. Family history could not be obtained.  PAST MEDICAL HISTORY:   Past Medical History  Diagnosis Date  . A-fib (HCC)   . Diabetes (HCC)   . Hypertension   . Hyperlipidemia   . Dementia     PAST SURGICAL HISTORY: No past surgical history on file.  SOCIAL HISTORY:  Social History  Substance Use Topics  . Smoking status: Never Smoker   . Smokeless tobacco: Not on file  . Alcohol Use: No    FAMILY HISTORY: No family history on file.  DRUG ALLERGIES: No Known Allergies  REVIEW OF SYSTEMS:  Could not be obtained secondary to confusion     MEDICATIONS AT HOME:  Prior to Admission medications   Medication Sig Start Date End Date Taking? Authorizing Provider   aspirin EC 81 MG tablet Take 81 mg by mouth daily.    Historical Provider, MD  atorvastatin (LIPITOR) 10 MG tablet Take 10 mg by mouth daily.    Historical Provider, MD  azelastine (ASTELIN) 0.1 % nasal spray Place 1 spray into both nostrils 2 (two) times daily. Use in each nostril as directed    Historical Provider, MD  cilostazol (PLETAL) 50 MG tablet Take 50 mg by mouth 2 (two) times daily.    Historical Provider, MD  fluticasone (FLONASE) 50 MCG/ACT nasal spray Place 1 spray into both nostrils daily.    Historical Provider, MD  glimepiride (AMARYL) 4 MG tablet Take 4 mg by mouth every morning.    Historical Provider, MD  metoprolol tartrate (LOPRESSOR) 25 MG tablet Take 25 mg by mouth 2 (two) times daily.    Historical Provider, MD  ramipril (ALTACE) 2.5 MG capsule Take 2.5 mg by mouth daily.    Historical Provider, MD      PHYSICAL EXAMINATION:   VITAL SIGNS: Blood pressure 174/79, pulse 105, temperature 101.7 F (38.7 C), temperature source Rectal, resp. rate 21, height 6' (1.829 m), weight 61.236 kg (135 lb), SpO2 96 %.  GENERAL:  82 y.o.-year-old patient lying in the bed with no acute distress.  EYES: Pupils equal, round, reactive to light and accommodation. No scleral icterus. Extraocular muscles intact.  HEENT: Head atraumatic, normocephalic. Oropharynx and nasopharynx clear.  NECK:  Supple, no jugular venous distention. No thyroid enlargement, no tenderness.  LUNGS : Decreased breath sounds bilaterally, no wheezing,scattered rales heard, no rhonchi or crepitation. No use of accessory muscles of respiration.  CARDIOVASCULAR: S1, S2 irregular No murmurs, rubs, or gallops.  ABDOMEN: Soft, nontender, nondistended. Bowel sounds present. No organomegaly or mass.  EXTREMITIES: No pedal edema, cyanosis, or clubbing.  NEUROLOGIC: Alert,awake not completely oriented to time ,place and person. PSYCHIATRIC: could nto be completely assessed SKIN: No obvious rash, lesion, or ulcer.    LABORATORY PANEL:   CBC  Recent Labs Lab 02/19/15 2144  WBC 8.8  HGB 13.8  HCT 40.7  PLT 142*  MCV 96.6  MCH 32.7  MCHC 33.9  RDW 12.4   ------------------------------------------------------------------------------------------------------------------  Chemistries   Recent Labs Lab 02/19/15 2144  NA 128*  K 4.5  CL 95*  CO2 22  GLUCOSE 119*  BUN 24*  CREATININE 0.95  CALCIUM 8.7*  AST 36  ALT 26  ALKPHOS 61  BILITOT 1.1   ------------------------------------------------------------------------------------------------------------------ estimated creatinine clearance is 40.3 mL/min (by C-G formula based on Cr of 0.95). ------------------------------------------------------------------------------------------------------------------ No results for input(s): TSH, T4TOTAL, T3FREE, THYROIDAB in the last 72 hours.  Invalid input(s): FREET3   Coagulation profile No results for input(s): INR, PROTIME in the last 168 hours. ------------------------------------------------------------------------------------------------------------------- No results for input(s): DDIMER in the last 72 hours. -------------------------------------------------------------------------------------------------------------------  Cardiac Enzymes  Recent Labs Lab 02/19/15 2144  TROPONINI 0.05*   ------------------------------------------------------------------------------------------------------------------ Invalid input(s): POCBNP  ---------------------------------------------------------------------------------------------------------------  Urinalysis    Component Value Date/Time   COLORURINE YELLOW* 02/19/2015 2143   COLORURINE Yellow 04/14/2011 1610   APPEARANCEUR CLEAR* 02/19/2015 2143   APPEARANCEUR Clear 04/14/2011 1610   LABSPEC 1.017 02/19/2015 2143   LABSPEC 1.010 04/14/2011 1610   PHURINE 5.0 02/19/2015 2143   PHURINE 5.0 04/14/2011 1610   GLUCOSEU NEGATIVE  02/19/2015 2143   GLUCOSEU >=500 04/14/2011 1610   HGBUR NEGATIVE 02/19/2015 2143   HGBUR Negative 04/14/2011 1610   BILIRUBINUR NEGATIVE 02/19/2015 2143   BILIRUBINUR Negative 04/14/2011 1610   KETONESUR 1+* 02/19/2015 2143   KETONESUR Negative 04/14/2011 1610   PROTEINUR 30* 02/19/2015 2143   PROTEINUR Negative 04/14/2011 1610   NITRITE NEGATIVE 02/19/2015 2143   NITRITE Negative 04/14/2011 1610   LEUKOCYTESUR NEGATIVE 02/19/2015 2143   LEUKOCYTESUR Negative 04/14/2011 1610     RADIOLOGY: Dg Chest 2 View  02/19/2015  CLINICAL DATA:  Acute onset of altered mental status. Confusion. Cough. Initial encounter. EXAM: CHEST  2 VIEW COMPARISON:  Chest radiograph performed 04/14/2011 FINDINGS: The lungs are well-aerated. Vascular congestion is noted. Minimally increased interstitial markings could reflect minimal interstitial edema. There is no evidence of pleural effusion or pneumothorax. The heart is borderline enlarged. The patient is status post median sternotomy, with evidence of prior CABG. No acute osseous abnormalities are seen. A moderate to large hiatal hernia is noted, partially filled with fluid and air. IMPRESSION: 1. Vascular congestion and borderline cardiomegaly. Mildly increased interstitial markings could reflect minimal interstitial edema. 2. Moderate to large hiatal hernia, partially filled with fluid and air. Electronically Signed   By: Roanna RaiderJeffery  Chang M.D.   On: 02/19/2015 23:16   Ct Head Wo Contrast  02/19/2015  CLINICAL DATA:  Acute onset of confusion. Altered mental status. Initial encounter. EXAM: CT HEAD WITHOUT CONTRAST TECHNIQUE: Contiguous axial images were obtained from the base of the skull through the vertex without intravenous contrast. COMPARISON:  None. FINDINGS: There is no evidence of acute infarction, mass lesion, or intra- or extra-axial hemorrhage on CT. Prominence of the ventricles and sulci reflects mild to moderate  cortical volume loss. Mild cerebellar atrophy  is noted. Scattered periventricular and subcortical white matter change likely reflects small vessel ischemic microangiopathy. Small chronic lacunar infarcts are noted at the left basal ganglia. The brainstem and fourth ventricle are within normal limits. The cerebral hemispheres demonstrate grossly normal gray-white differentiation. No mass effect or midline shift is seen. There is no evidence of fracture; visualized osseous structures are unremarkable in appearance. The visualized portions of the orbits are within normal limits. The paranasal sinuses and mastoid air cells are well-aerated. No significant soft tissue abnormalities are seen. IMPRESSION: 1. No acute intracranial pathology seen on CT. 2. Mild to moderate cortical volume loss and scattered small vessel ischemic microangiopathy. 3. Small chronic lacunar infarcts at the left basal ganglia. Electronically Signed   By: Roanna Raider M.D.   On: 02/19/2015 23:20    EKG: Orders placed or performed during the hospital encounter of 02/19/15  . ED EKG  . ED EKG  . EKG 12-Lead  . EKG 12-Lead    IMPRESSION AND PLAN: 82 year old elderly male patient with history of diabetes mellitus, hypertension, hyperlipidemia and atrial fibrillation was transferred from the nursing home for change in mental status fever and cough. Admitting diagnosis 1. Healthcare associated pneumonia 2. Early sepsis secondary to pneumonia 3. Altered mental status which could be secondary to pneumonia 4. Mildly elevated troponin which could be secondary to infection 5.Hyponatremia  Treatment plan 1. Admit patient to telemetry 2. Start spectrum IV antibiotics vancomycin and IV Zosyn 3. Cycle troponin to rule out ischemia 4. Anticoagulate patient with full dose Lovenox until ischemia ruled out 5. IV hydration with normal saline 6. Monitor sodium level.  All the records are reviewed and case discussed with ED provider. Management plans discussed with the patient,  family and they are in agreement.  CODE STATUS:FULL    TOTAL TIME TAKING CARE OF THIS PATIENT: 30 minutes.    Ihor Austin M.D on 02/19/2015 at 11:57 PM  Between 7am to 6pm - Pager - [phone]  After 6pm go to [email or web address] - password [email or web address] [facility]  [facility]  Office  [phone]  CC: Primary care physician; No primary care provider on file.  Family and surgical history could not be obtained secondary to confusion and altered mental status.

## 2015-02-19 NOTE — ED Notes (Signed)
Per EMS: from [facility].  Every Wednesday daughter calls patient, reports to staff today he is not himself, asked to bring to hospital.  Staff reports that today the patient has not been acting himselfself.  staff states patient confused, forgot how to use microwave, having trouble with legs.  reports change in mental status.  cbg 130.  apartment was 90 degrees, hot to touch, strong urine smell, c/o back pain.  159/87, 96% room air, is alert and orietned.

## 2015-02-20 ENCOUNTER — Encounter: Payer: Self-pay | Admitting: *Deleted

## 2015-02-20 DIAGNOSIS — L8990 Pressure ulcer of unspecified site, unspecified stage: Secondary | ICD-10-CM | POA: Diagnosis present

## 2015-02-20 LAB — CBC WITH DIFFERENTIAL/PLATELET
BASOS PCT: 0 %
Basophils Absolute: 0 10*3/uL (ref 0–0.1)
EOS ABS: 0 10*3/uL (ref 0–0.7)
Eosinophils Relative: 0 %
HEMATOCRIT: 37.2 % — AB (ref 40.0–52.0)
HEMOGLOBIN: 13.1 g/dL (ref 13.0–18.0)
LYMPHS ABS: 0.8 10*3/uL — AB (ref 1.0–3.6)
Lymphocytes Relative: 10 %
MCH: 34.1 pg — ABNORMAL HIGH (ref 26.0–34.0)
MCHC: 35.3 g/dL (ref 32.0–36.0)
MCV: 96.7 fL (ref 80.0–100.0)
MONOS PCT: 10 %
Monocytes Absolute: 0.8 10*3/uL (ref 0.2–1.0)
NEUTROS ABS: 6.3 10*3/uL (ref 1.4–6.5)
NEUTROS PCT: 80 %
Platelets: 123 10*3/uL — ABNORMAL LOW (ref 150–440)
RBC: 3.85 MIL/uL — AB (ref 4.40–5.90)
RDW: 12.6 % (ref 11.5–14.5)
WBC: 7.9 10*3/uL (ref 3.8–10.6)

## 2015-02-20 LAB — BASIC METABOLIC PANEL
Anion gap: 6 (ref 5–15)
BUN: 21 mg/dL — ABNORMAL HIGH (ref 6–20)
CHLORIDE: 102 mmol/L (ref 101–111)
CO2: 25 mmol/L (ref 22–32)
Calcium: 8.3 mg/dL — ABNORMAL LOW (ref 8.9–10.3)
Creatinine, Ser: 0.91 mg/dL (ref 0.61–1.24)
GFR calc non Af Amer: >60 mL/min (ref >60)
Glucose, Bld: 78 mg/dL (ref 65–99)
POTASSIUM: 4.3 mmol/L (ref 3.5–5.1)
SODIUM: 133 mmol/L — AB (ref 135–145)

## 2015-02-20 LAB — BLOOD GAS, VENOUS
ACID-BASE DEFICIT: 2.3 mmol/L — AB (ref 0.0–2.0)
BICARBONATE: 22.5 meq/L (ref 21.0–28.0)
PATIENT TEMPERATURE: 37
PCO2 VEN: 38 mmHg — AB (ref 44.0–60.0)
PH VEN: 7.38 (ref 7.320–7.430)

## 2015-02-20 LAB — TROPONIN I
TROPONIN I: 0.06 ng/mL — AB (ref <0.031)
TROPONIN I: 0.07 ng/mL — AB (ref <0.031)
Troponin I: 0.05 ng/mL — ABNORMAL HIGH (ref <0.031)

## 2015-02-20 LAB — STREP PNEUMONIAE URINARY ANTIGEN: STREP PNEUMO URINARY ANTIGEN: NEGATIVE

## 2015-02-20 LAB — LACTIC ACID, PLASMA: LACTIC ACID, VENOUS: 1.1 mmol/L (ref 0.5–2.0)

## 2015-02-20 MED ORDER — ATORVASTATIN CALCIUM 10 MG PO TABS
10.0000 mg | ORAL_TABLET | Freq: Every day | ORAL | Status: DC
  Administered 2015-02-20 – 2015-02-21 (×2): 10 mg via ORAL
  Filled 2015-02-20 (×2): qty 1

## 2015-02-20 MED ORDER — ASPIRIN EC 81 MG PO TBEC
81.0000 mg | DELAYED_RELEASE_TABLET | Freq: Every day | ORAL | Status: DC
Start: 2015-02-20 — End: 2015-02-21
  Administered 2015-02-20 – 2015-02-21 (×2): 81 mg via ORAL
  Filled 2015-02-20: qty 1

## 2015-02-20 MED ORDER — GLIMEPIRIDE 2 MG PO TABS
4.0000 mg | ORAL_TABLET | ORAL | Status: DC
  Administered 2015-02-20: 4 mg via ORAL
  Filled 2015-02-20: qty 2
  Filled 2015-02-20 (×2): qty 1

## 2015-02-20 MED ORDER — CILOSTAZOL 100 MG PO TABS
50.0000 mg | ORAL_TABLET | Freq: Two times a day (BID) | ORAL | Status: DC
  Administered 2015-02-20 – 2015-02-21 (×3): 50 mg via ORAL
  Filled 2015-02-20 (×3): qty 1

## 2015-02-20 MED ORDER — AZELASTINE HCL 0.1 % NA SOLN
1.0000 | Freq: Two times a day (BID) | NASAL | Status: DC
  Administered 2015-02-20 – 2015-02-21 (×2): 1 via NASAL
  Filled 2015-02-20 (×2): qty 30

## 2015-02-20 MED ORDER — VANCOMYCIN HCL IN DEXTROSE 1-5 GM/200ML-% IV SOLN
1000.0000 mg | INTRAVENOUS | Status: DC
  Administered 2015-02-20: 1000 mg via INTRAVENOUS
  Filled 2015-02-20 (×2): qty 200

## 2015-02-20 MED ORDER — ENSURE ENLIVE PO LIQD
237.0000 mL | Freq: Two times a day (BID) | ORAL | Status: DC
Start: 2015-02-20 — End: 2015-02-21
  Administered 2015-02-20 – 2015-02-21 (×2): 237 mL via ORAL

## 2015-02-20 MED ORDER — RAMIPRIL 2.5 MG PO CAPS
2.5000 mg | ORAL_CAPSULE | Freq: Every day | ORAL | Status: DC
  Administered 2015-02-20 – 2015-02-21 (×2): 2.5 mg via ORAL
  Filled 2015-02-20 (×2): qty 1

## 2015-02-20 MED ORDER — SODIUM CHLORIDE 0.9 % IV SOLN
INTRAVENOUS | Status: DC
  Administered 2015-02-20: 02:00:00 via INTRAVENOUS

## 2015-02-20 MED ORDER — FLUTICASONE PROPIONATE 50 MCG/ACT NA SUSP
1.0000 | Freq: Every day | NASAL | Status: DC
  Administered 2015-02-20 – 2015-02-21 (×2): 1 via NASAL
  Filled 2015-02-20: qty 16

## 2015-02-20 MED ORDER — METOPROLOL TARTRATE 25 MG PO TABS
25.0000 mg | ORAL_TABLET | Freq: Two times a day (BID) | ORAL | Status: DC
  Administered 2015-02-20 – 2015-02-21 (×3): 25 mg via ORAL
  Filled 2015-02-20 (×3): qty 1

## 2015-02-20 MED ORDER — VANCOMYCIN HCL IN DEXTROSE 1-5 GM/200ML-% IV SOLN: 1000.0000 mg | INTRAVENOUS | Status: DC

## 2015-02-20 MED ORDER — PIPERACILLIN-TAZOBACTAM 3.375 G IVPB
3.3750 g | Freq: Three times a day (TID) | INTRAVENOUS | Status: DC
  Administered 2015-02-20 – 2015-02-21 (×4): 3.375 g via INTRAVENOUS
  Filled 2015-02-20 (×6): qty 50

## 2015-02-20 MED ORDER — ENOXAPARIN SODIUM 40 MG/0.4ML ~~LOC~~ SOLN
40.0000 mg | SUBCUTANEOUS | Status: DC
  Filled 2015-02-20: qty 0.4

## 2015-02-20 NOTE — Evaluation (Signed)
Physical Therapy Evaluation Patient Details Name: Evan Vasquez MRN: [id number] DOB: 02-Jul-1917 Today's Date: 02/20/2015   History of Present Illness  Evan Vasquez is a 82 y.o. male with a known history of diabetes mellitus, hypertension, hyperlipidemia, chronic atrial fibrillation is a resident of nursing home presented to the emergency room with change in mental status and confusion. Patient was diagnosed with pneumonia and admitted to telemetry;   Clinical Impression  82 yo Pleasant male came to ED with confusion and was diagnosed with pneumonia.Patient was living at assistive living ([facility]) prior to admittance. He reports being mostly modified independent with all ADLs using rollator for ambulation , shower chair for showers and was able to dress himself. He reports having an exercise regiment that he did on a regular basis. Currently he is oriented to person, place, situation and date. He does have a history of dementia and was confused at admittance. He demonstrates some weakness in BLE. He is min A for bed mobility and transfers. Patient required min A for gait safety and ambulates at slower gait speed of 1.66 feet/sec. His clinical presentation is currently stable as he is doing better and not getting worse. Patient would benefit from additional skilled PT intervention to improve strength and functional mobility;     Follow Up Recommendations Home health PT    Equipment Recommendations  None recommended by PT    Recommendations for Other Services       Precautions / Restrictions Precautions Precautions: Fall Restrictions Weight Bearing Restrictions: No      Mobility  Bed Mobility Overal bed mobility: Needs Assistance Bed Mobility: Supine to Sit     Supine to sit: Min assist     General bed mobility comments: patient required mod VCs to utilize bed rails and required 1 HHA to pull self into sitting; Patient demonstrates increased posterior trunk lean due to impaired  balance; x1 rep;  Transfers Overall transfer level: Needs assistance Equipment used: Rolling walker (2 wheeled) Transfers: Sit to/from Stand Sit to Stand: Min assist         General transfer comment: Patient required min A for sit<>Stand from bed to chair with RW with mod Vcs to get closer to chair prior to sitting down; required min VCs for hand placement for safety;   Ambulation/Gait Ambulation/Gait assistance: Min assist Ambulation Distance (Feet): 75 Feet Assistive device: Rolling walker (2 wheeled) Gait Pattern/deviations: Step-through pattern;Trunk flexed;Decreased step length - left;Decreased step length - right;Decreased dorsiflexion - right;Decreased dorsiflexion - left;Shuffle;Narrow base of support Gait velocity: 1.66 feet/sec Gait velocity interpretation: <1.8 ft/sec, indicative of risk for recurrent falls General Gait Details: ambulates at slower gait speed (1.66 feet/sec) with forward flexed posture; Required min A for balance; Patient also required mod VCs to increase step length, increase DF at heel strike for better foot clearance and to stay closer to walker for balance/safety;   Stairs            Wheelchair Mobility    Modified Rankin (Stroke Patients Only)       Balance Overall balance assessment: Needs assistance Sitting-balance support: Single extremity supported;Feet supported Sitting balance-Leahy Scale: Fair Sitting balance - Comments: patient demonstrates posterior trunk lean especially with dynamic tasks without HHA;  Postural control: Posterior lean Standing balance support: Bilateral upper extremity supported Standing balance-Leahy Scale: Poor Standing balance comment: static standing balance is fair but dynamic standing balance is poor as patient is impulsive and unsteady with gait tasks  Pertinent Vitals/Pain Pain Assessment: No/denies pain    Home Living Family/patient expects to be discharged  to:: Assisted living ([facility])               Home Equipment: Dan HumphreysWalker - 2 wheels;Walker - 4 wheels;Cane - single point Additional Comments: Patient reports that recently he started using rollator for gait tasks so that he would have a seat when he got tired.    Prior Function Level of Independence: Independent with assistive device(s)         Comments: reports being able to bathe and dress self independently although he reports that he didn't take showers as often as he should; does eat meals prepared by [facility];      Hand Dominance        Extremity/Trunk Assessment   Upper Extremity Assessment: Overall WFL for tasks assessed           Lower Extremity Assessment: RLE deficits/detail;LLE deficits/detail RLE Deficits / Details: hip grossly 4/5, knee extension 4-/5 flexion 4/5, ankle 3+/5; intact light touch sensation LLE Deficits / Details: hip grossly 4/5, knee extension 4-/5 flexion 4/5, ankle 3+/5; intact light touch sensation  Cervical / Trunk Assessment: Kyphotic  Communication   Communication: HOH  Cognition Arousal/Alertness: Awake/alert Behavior During Therapy: Restless Overall Cognitive Status: Within Functional Limits for tasks assessed (alert and oriented to date, place and situation)                      General Comments General comments (skin integrity, edema, etc.): intact by gross assessment; nursing does report a grade II ulcer on sacrum    Exercises Other Exercises Other Exercises: PT instructed patient in long sitting heel/toe raises to increase circulation; recommended patient try sitting up for at least 1 hour to improve pulmonary function;       Assessment/Plan    PT Assessment Patient needs continued PT services  PT Diagnosis Difficulty walking;Generalized weakness   PT Problem List Decreased strength;Decreased safety awareness;Decreased activity tolerance;Decreased balance;Decreased mobility;Cardiopulmonary status limiting  activity  PT Treatment Interventions Gait training;Patient/family education;Functional mobility training;Therapeutic activities;Therapeutic exercise;Balance training   PT Goals (Current goals can be found in the Care Plan section) Acute Rehab PT Goals Patient Stated Goal: "to get back to walking as I did before" PT Goal Formulation: With patient Time For Goal Achievement: 03/06/15 Potential to Achieve Goals: Good    Frequency Min 2X/week   Barriers to discharge   NA    Co-evaluation               End of Session Equipment Utilized During Treatment: Gait belt Activity Tolerance: Patient tolerated treatment well Patient left: in chair;with call bell/phone within reach;with chair alarm set Nurse Communication: Mobility status         Time: 1635-1700 PT Time Calculation (min) (ACUTE ONLY): 25 min   Charges:   PT Evaluation $PT Eval Low Complexity: 1 Procedure     PT G Codes:        Eara Burruel PT, DPT 02/20/2015, 5:23 PM

## 2015-02-20 NOTE — Plan of Care (Signed)
Problem: Activity: Goal: Risk for activity intolerance will decrease Outcome: Progressing Treating pt for PNA to improve activity tolerance

## 2015-02-20 NOTE — Progress Notes (Signed)
[facility] [place] NAME: Evan Vasquez    MR#:  [id number]  DATE OF BIRTH:  [birth year]  SUBJECTIVE:   Pt. here due to altered mental status and noted to have sepsis due to pneumonia.  Mental status improved and patient's sister is at bedside. Afebrile, hemodynamically stable. + cough, congestion.  REVIEW OF SYSTEMS:    Review of Systems  Constitutional: Negative for fever and chills.  HENT: Negative for congestion and tinnitus.   Eyes: Negative for blurred vision and double vision.  Respiratory: Positive for cough and sputum production. Negative for shortness of breath and wheezing.   Cardiovascular: Negative for chest pain, orthopnea and PND.  Gastrointestinal: Negative for nausea, vomiting, abdominal pain and diarrhea.  Genitourinary: Negative for dysuria and hematuria.  Neurological: Negative for dizziness, sensory change and focal weakness.  All other systems reviewed and are negative.   Nutrition: Heart Healthy/Carb modified Tolerating Diet: yes Tolerating PT: Await Eval.   DRUG ALLERGIES:  No Known Allergies  VITALS:  Blood pressure 121/64, pulse 64, temperature 97.5 F (36.4 C), temperature source Oral, resp. rate 18, height 6' (1.829 m), weight 61.236 kg (135 lb), SpO2 96 %.  PHYSICAL EXAMINATION:   Physical Exam  GENERAL:  82 y.o.-year-old patient lying in the bed with no acute distress.  EYES: Pupils equal, round, reactive to light and accommodation. No scleral icterus. Extraocular muscles intact.  HEENT: Head atraumatic, normocephalic. Oropharynx and nasopharynx clear.  NECK:  Supple, no jugular venous distention. No thyroid enlargement, no tenderness.  LUNGS: Normal breath sounds bilaterally, no wheezing, rales, rhonchi. No use of accessory muscles of respiration.  CARDIOVASCULAR: S1, S2 normal. No murmurs, rubs, or gallops.  ABDOMEN: Soft, nontender, nondistended. Bowel sounds present. No organomegaly or mass.   EXTREMITIES: No cyanosis, clubbing or edema b/l.    NEUROLOGIC: Cranial nerves II through XII are intact. No focal Motor or sensory deficits b/l. Globally weak.   PSYCHIATRIC: The patient is alert and oriented x 2.  SKIN: No obvious rash, lesion, or ulcer.    LABORATORY PANEL:   CBC  Recent Labs Lab 02/20/15 0439  WBC 7.9  HGB 13.1  HCT 37.2*  PLT 123*   ------------------------------------------------------------------------------------------------------------------  Chemistries   Recent Labs Lab 02/19/15 2144 02/20/15 0439  NA 128* 133*  K 4.5 4.3  CL 95* 102  CO2 22 25  GLUCOSE 119* 78  BUN 24* 21*  CREATININE 0.95 0.91  CALCIUM 8.7* 8.3*  AST 36  --   ALT 26  --   ALKPHOS 61  --   BILITOT 1.1  --    ------------------------------------------------------------------------------------------------------------------  Cardiac Enzymes  Recent Labs Lab 02/20/15 1241  TROPONINI 0.05*   ------------------------------------------------------------------------------------------------------------------  RADIOLOGY:  Dg Chest 2 View  02/19/2015  CLINICAL DATA:  Acute onset of altered mental status. Confusion. Cough. Initial encounter. EXAM: CHEST  2 VIEW COMPARISON:  Chest radiograph performed 04/14/2011 FINDINGS: The lungs are well-aerated. Vascular congestion is noted. Minimally increased interstitial markings could reflect minimal interstitial edema. There is no evidence of pleural effusion or pneumothorax. The heart is borderline enlarged. The patient is status post median sternotomy, with evidence of prior CABG. No acute osseous abnormalities are seen. A moderate to large hiatal hernia is noted, partially filled with fluid and air. IMPRESSION: 1. Vascular congestion and borderline cardiomegaly. Mildly increased interstitial markings could reflect minimal interstitial edema. 2. Moderate to large hiatal hernia, partially filled with fluid and air. Electronically Signed    By:  Garald Balding M.D.   On: 02/19/2015 23:16   Ct Head Wo Contrast  02/19/2015  CLINICAL DATA:  Acute onset of confusion. Altered mental status. Initial encounter. EXAM: CT HEAD WITHOUT CONTRAST TECHNIQUE: Contiguous axial images were obtained from the base of the skull through the vertex without intravenous contrast. COMPARISON:  None. FINDINGS: There is no evidence of acute infarction, mass lesion, or intra- or extra-axial hemorrhage on CT. Prominence of the ventricles and sulci reflects mild to moderate cortical volume loss. Mild cerebellar atrophy is noted. Scattered periventricular and subcortical white matter change likely reflects small vessel ischemic microangiopathy. Small chronic lacunar infarcts are noted at the left basal ganglia. The brainstem and fourth ventricle are within normal limits. The cerebral hemispheres demonstrate grossly normal gray-white differentiation. No mass effect or midline shift is seen. There is no evidence of fracture; visualized osseous structures are unremarkable in appearance. The visualized portions of the orbits are within normal limits. The paranasal sinuses and mastoid air cells are well-aerated. No significant soft tissue abnormalities are seen. IMPRESSION: 1. No acute intracranial pathology seen on CT. 2. Mild to moderate cortical volume loss and scattered small vessel ischemic microangiopathy. 3. Small chronic lacunar infarcts at the left basal ganglia. Electronically Signed   By: Garald Balding M.D.   On: 02/19/2015 23:20     ASSESSMENT AND PLAN:   82 year old male with past medical history of chronic atrial fibrillation, diabetes, hypertension, hyperkalemia, dementia who presented to the hospital due to altered mental status and also noted to have a pneumonia.  #1 altered mental status-metabolic encephalopathy due to underlying pneumonia/sepsis with also baseline dementia. -Patient has improved since yesterday with IV fluids and IV antibiotics. We'll  continue to monitor.  #2 sepsis-patient met criteria on admission with fever, tachycardia, chest x-ray findings and upper respiratory symptoms consistent with pneumonia. -Continue IV fluids, continue IV vancomycin, Zosyn. Follow fever curve, follow blood cultures.  #3 hyponatremia-mild and continue gentle IV fluids and follow sodium which is improving.  #4 elevated troponin-likely in the setting of demand ischemia from pneumonia and sepsis. -No evidence of acute coronary syndrome. DC telemetry.  #5 diabetes-blood sugars were in the low side. I will DC his Amaryl for now.  #6 hyperlipidemia-continue atorvastatin.  #7 hypertension-hold antihypertensives given sepsis and relative hypotension. Will resume them tomorrow.  #8 Peripheral vascular disease-continue Pletal   All the records are reviewed and case discussed with Care Management/Social Workerr. Management plans discussed with the patient, family and they are in agreement.  CODE STATUS: Full  DVT Prophylaxis: Lovenox  TOTAL TIME TAKING CARE OF THIS PATIENT: 30 minutes.   POSSIBLE D/C IN 1-2 DAYS, DEPENDING ON CLINICAL CONDITION.   Henreitta Leber M.D on 02/20/2015 at 4:17 PM  Between 7am to 6pm - Pager - [phone]  After 6pm go to [email or web address] - password [email or web address] [facility]  [place] [facility]  Office  [phone]  CC: Primary care physician; No primary care provider on file.

## 2015-02-20 NOTE — Progress Notes (Signed)
Pt admitted to 252 from ED. Pt oriented to unit, tele confirmed with Fleet Contrasachel, RN and Daniell,NT. Skin checked with Tonny Branchachel M, RN. See flowsheet for additional skin assessment. NO c/o at this time. Patient resting comfortably. Continue to monitor

## 2015-02-20 NOTE — Progress Notes (Signed)
ANTIBIOTIC CONSULT NOTE - INITIAL  Pharmacy Consult for vancomycin/Zosyn Indication: pneumonia  No Known Allergies  Patient Measurements: Height: 6' (182.9 cm) Weight: 135 lb (61.236 kg) IBW/kg (Calculated) : 77.6 Adjusted Body Weight: 61.2 kg  Vital Signs: Temp: 101.7 F (38.7 C) (01/04 2135) Temp Source: Rectal (01/04 2135) BP: 128/66 mmHg (01/04 2330) Pulse Rate: 87 (01/04 2200) Intake/Output from previous day:   Intake/Output from this shift:    Labs:  Recent Labs  02/19/15 2144  WBC 8.8  HGB 13.8  PLT 142*  CREATININE 0.95   Estimated Creatinine Clearance: 40.3 mL/min (by C-G formula based on Cr of 0.95). No results for input(s): VANCOTROUGH, VANCOPEAK, VANCORANDOM, GENTTROUGH, GENTPEAK, GENTRANDOM, TOBRATROUGH, TOBRAPEAK, TOBRARND, AMIKACINPEAK, AMIKACINTROU, AMIKACIN in the last 72 hours.   Microbiology: No results found for this or any previous visit (from the past 720 hour(s)).  Medical History: Past Medical History  Diagnosis Date  . A-fib (HCC)   . Diabetes (HCC)   . Hypertension   . Hyperlipidemia   . Dementia     Medications:  Infusions:   Assessment: 89 yom cc AMS/code sepsis with significant PMH from nursing home with decreased responsiveness/lethargy. Possible early sepsis/PNA, pharmacy consulted to dose vancomycin and Zosyn.  Vd 42.8 L, Ke 0.039 hr-1, T1/2 18.3 hr  Goal of Therapy:  Vancomycin trough level 15-20 mcg/ml  Plan:  Expected duration 7 days with resolution of temperature and/or normalization of WBC. Zosyn 3.375 gm IV Q8H EI and vancomycin 1 gm IV Q24H with stacked dosing, second dose approximately 12 hours after first. Pharmacy will continue to follow and adjust as needed to maintain trough 15 to 20 mcg/mL.  Carola FrostNathan A Keymora Grillot, Pharm.D., BCPS Clinical Pharmacist 02/20/2015,12:15 AM

## 2015-02-20 NOTE — Progress Notes (Signed)
Initial Nutrition Assessment    INTERVENTION:   Meals and Snacks: Cater to patient preferences Medical Food Supplement Therapy: recommend Ensure Enlive po BID, each supplement provides 350 kcal and 20 grams of protein  NUTRITION DIAGNOSIS:   Inadequate oral intake related to wound healing, acute illness as evidenced by per patient/family report, meal completion < 25%.  GOAL:   Patient will meet greater than or equal to 90% of their needs  MONITOR:    (Energy Intake, Anthropometrics, Digestive System, Electroyte/Renal Profile)  REASON FOR ASSESSMENT:    (Pressure Ulcer)    ASSESSMENT:    Evan Vasquez admitted with pneumonia with early sepsis, AMS, hyponatremia  Past Medical History  Diagnosis Date  . A-fib (HCC)   . Diabetes (HCC)   . Hypertension   . Hyperlipidemia   . Dementia     Diet Order:  Diet heart healthy/carb modified Room service appropriate?: Yes; Fluid consistency:: Thin   Energy Intake: Evan Vasquez ate good breakfast this AM but not as much at lunch, recorded po intake 20% at lunch; per report, Evan Vasquez sleeping this AM and did not eat breakfast until late and then lunch tray came soon after  Food and nutrition Related history: Evan Vasquez reports good appetite prior to admission  Electrolyte and Renal Profile:  Recent Labs Lab 02/19/15 2144 02/20/15 0439  BUN 24* 21*  CREATININE 0.95 0.91  NA 128* 133*  K 4.5 4.3   Glucose Profile: No results for input(s): GLUCAP in the last 72 hours. Meds: NS at 50 ml/hr  Skin: stage II pressure ulcer on buttock  Last BM: 02/20/15  Height:   Ht Readings from Last 1 Encounters:  02/19/15 6' (1.829 m)    Weight: no previous weight encounters, Evan Vasquez belives his wt has been stable  Wt Readings from Last 1 Encounters:  02/19/15 135 lb (61.236 kg)    BMI:  Body mass index is 18.31 kg/(m^2).   LOW Care Level  Romelle StarcherCate Mesa Janus MS, IowaRD, LDN [phone] Pager  [phone] Weekend/On-Call Pager

## 2015-02-21 LAB — C DIFFICILE QUICK SCREEN W PCR REFLEX
C DIFFICILE (CDIFF) INTERP: NEGATIVE
C DIFFICILE (CDIFF) TOXIN: NEGATIVE
C Diff antigen: NEGATIVE

## 2015-02-21 LAB — URINE CULTURE: CULTURE: NO GROWTH

## 2015-02-21 MED ORDER — LOPERAMIDE HCL 2 MG PO CAPS
2.0000 mg | ORAL_CAPSULE | Freq: Four times a day (QID) | ORAL | Status: DC | PRN
  Filled 2015-02-21 (×2): qty 1

## 2015-02-21 MED ORDER — LOPERAMIDE HCL 2 MG PO CAPS: 2.0000 mg | ORAL_CAPSULE | Freq: Four times a day (QID) | ORAL | Status: DC | PRN

## 2015-02-21 MED ORDER — LEVOFLOXACIN 500 MG PO TABS: 500.0000 mg | ORAL_TABLET | Freq: Every day | ORAL | Status: AC

## 2015-02-21 NOTE — Discharge Summary (Signed)
[place] at [place] NAME: Evan Vasquez    MR#:  [id number]  DATE OF BIRTH:  20-Jan-1918  DATE OF ADMISSION:  02/19/2015 ADMITTING PHYSICIAN: Saundra Shelling, MD  DATE OF DISCHARGE: 02/21/2015  2:04 PM  PRIMARY CARE PHYSICIAN: No primary care provider on file.    ADMISSION DIAGNOSIS:  Hyponatremia [E87.1] Healthcare-associated pneumonia [J18.9] Elevated troponin [R79.89] Sepsis, due to unspecified organism ([place]) [A41.9]  DISCHARGE DIAGNOSIS:  Principal Problem:   Healthcare-associated pneumonia Active Problems:   Hyponatremia   Pressure ulcer   SECONDARY DIAGNOSIS:   Past Medical History  Diagnosis Date  . A-fib ([place])   . Diabetes ([place])   . Hypertension   . Hyperlipidemia   . Dementia     HOSPITAL COURSE:   82 year old male with past medical history of chronic atrial fibrillation, diabetes, hypertension, hyperkalemia, dementia who presented to the hospital due to altered mental status and also noted to have a pneumonia.  #1 altered mental status-this was metabolic encephalopathy due to underlying pneumonia/sepsis with also baseline dementia. -She was given IV antibiotics and IV fluids and his mental status is much improved and is back to baseline.  #2 sepsis-patient met criteria on admission with fever, tachycardia, chest x-ray findings and upper respiratory symptoms consistent with pneumonia. -Initially in the hospital patient was given broad-spectrum IV antibiotics with vancomycin and Zosyn. His blood cultures, urine cultures have been negative and he has had no further fever while in the hospital. He is currently being discharged on oral Levaquin for the next 7 days.  #3 hyponatremia-this was mild and patient was given some IV fluids and has normalized upon discharge.  #4 elevated troponin-likely in the setting of demand ischemia from pneumonia and sepsis. -No evidence of acute coronary syndrome. He is currently chest  pain-free and stable.  #5 diabetes-patient's blood sugars were a bit on the low side. I am taking him off Amaryl indefinitely. His diabetes can be controlled with diet only for now.  #6 hyperlipidemia-he will continue atorvastatin.  #7 hypertension-she is antihypertensives were held while in the hospital. He will resume his metoprolol and ramipril upon discharge.  #8 Peripheral vascular disease-he will continue Pletal  #9 diarrhea-patient developed some diarrhea while in the hospital. I suspect this is probably related to broad-spectrum IV antibiotics. His C. difficile was negative. I discharge him on some as needed Imodium.  DISCHARGE CONDITIONS:   Stable  CONSULTS OBTAINED:  Treatment Team:  Henreitta Leber, MD  DRUG ALLERGIES:  No Known Allergies  DISCHARGE MEDICATIONS:   Discharge Medication List as of 02/21/2015  1:44 PM    START taking these medications   Details  levofloxacin (LEVAQUIN) 500 MG tablet Take 1 tablet (500 mg total) by mouth daily., Starting 02/21/2015, Until Fri 02/28/15, Print    loperamide (IMODIUM) 2 MG capsule Take 1 capsule (2 mg total) by mouth every 6 (six) hours as needed for diarrhea or loose stools., Starting 02/21/2015, Until Discontinued, Print      CONTINUE these medications which have NOT CHANGED   Details  ipratropium (ATROVENT) 0.03 % nasal spray Place 2 sprays into both nostrils 4 (four) times daily. As needed, Until Discontinued, Historical Med    aspirin EC 81 MG tablet Take 81 mg by mouth daily., Until Discontinued, Historical Med    atorvastatin (LIPITOR) 10 MG tablet Take 10 mg by mouth daily., Until Discontinued, Historical Med    azelastine (ASTELIN) 0.1 % nasal spray Place 1 spray into both  nostrils 2 (two) times daily. Use in each nostril as directed, Until Discontinued, Historical Med    cilostazol (PLETAL) 50 MG tablet Take 50 mg by mouth 2 (two) times daily., Until Discontinued, Historical Med    fluticasone (FLONASE) 50 MCG/ACT  nasal spray Place 1 spray into both nostrils daily., Until Discontinued, Historical Med    metoprolol tartrate (LOPRESSOR) 25 MG tablet Take 25 mg by mouth 2 (two) times daily., Until Discontinued, Historical Med    ramipril (ALTACE) 2.5 MG capsule Take 2.5 mg by mouth daily., Until Discontinued, Historical Med      STOP taking these medications     glimepiride (AMARYL) 4 MG tablet          DISCHARGE INSTRUCTIONS:   DIET:  Cardiac diet and Diabetic diet  DISCHARGE CONDITION:  Stable  ACTIVITY:  Activity as tolerated  OXYGEN:  Home Oxygen: No.   Oxygen Delivery: room air  DISCHARGE LOCATION:  Home with Home health nursing/PT.    If you experience worsening of your admission symptoms, develop shortness of breath, life threatening emergency, suicidal or homicidal thoughts you must seek medical attention immediately by calling 911 or calling your MD immediately  if symptoms less severe.  You Must read complete instructions/literature along with all the possible adverse reactions/side effects for all the Medicines you take and that have been prescribed to you. Take any new Medicines after you have completely understood and accpet all the possible adverse reactions/side effects.   Please note  You were cared for by a hospitalist during your hospital stay. If you have any questions about your discharge medications or the care you received while you were in the hospital after you are discharged, you can call the unit and asked to speak with the hospitalist on call if the hospitalist that took care of you is not available. Once you are discharged, your primary care physician will handle any further medical issues. Please note that NO REFILLS for any discharge medications will be authorized once you are discharged, as it is imperative that you return to your primary care physician (or establish a relationship with a primary care physician if you do not have one) for your aftercare needs  so that they can reassess your need for medications and monitor your lab values.     Today   Having some diarrhea but improved since  Yesterday.  C. Diff (-). No fever overnight. Mental status much improved.   VITAL SIGNS:  Blood pressure 114/55, pulse 85, temperature 98.1 F (36.7 C), temperature source Oral, resp. rate 16, height 6' (1.829 m), weight 61.236 kg (135 lb), SpO2 93 %.  I/O:   Intake/Output Summary (Last 24 hours) at 02/21/15 1656 Last data filed at 02/21/15 1123  Gross per 24 hour  Intake 1133.33 ml  Output      0 ml  Net 1133.33 ml    PHYSICAL EXAMINATION:   GENERAL: 82 y.o.-year-old patient lying in the bed with no acute distress.  EYES: Pupils equal, round, reactive to light and accommodation. No scleral icterus. Extraocular muscles intact.  HEENT: Head atraumatic, normocephalic. Oropharynx and nasopharynx clear.  NECK: Supple, no jugular venous distention. No thyroid enlargement, no tenderness.  LUNGS: Normal breath sounds bilaterally, no wheezing, rales, rhonchi. No use of accessory muscles of respiration.  CARDIOVASCULAR: S1, S2 normal. No murmurs, rubs, or gallops.  ABDOMEN: Soft, nontender, nondistended. Bowel sounds present. No organomegaly or mass.  EXTREMITIES: No cyanosis, clubbing or edema b/l.  NEUROLOGIC: Cranial nerves II  through XII are intact. No focal Motor or sensory deficits b/l. PSYCHIATRIC: The patient is alert and oriented x 2.  SKIN: No obvious rash, lesion, or ulcer.   DATA REVIEW:   CBC  Recent Labs Lab 02/20/15 0439  WBC 7.9  HGB 13.1  HCT 37.2*  PLT 123*    Chemistries   Recent Labs Lab 02/19/15 2144 02/20/15 0439  NA 128* 133*  K 4.5 4.3  CL 95* 102  CO2 22 25  GLUCOSE 119* 78  BUN 24* 21*  CREATININE 0.95 0.91  CALCIUM 8.7* 8.3*  AST 36  --   ALT 26  --   ALKPHOS 61  --   BILITOT 1.1  --     Cardiac Enzymes  Recent Labs Lab 02/20/15 1241  TROPONINI 0.05*    Microbiology Results   Results for orders placed or performed during the hospital encounter of 02/19/15  Urine culture     Status: None   Collection Time: 02/19/15  9:43 PM  Result Value Ref Range Status   Specimen Description URINE, RANDOM  Final   Special Requests NONE  Final   Culture NO GROWTH 2 DAYS  Final   Report Status 02/21/2015 FINAL  Final  Blood culture (routine x 2)     Status: None (Preliminary result)   Collection Time: 02/19/15  9:44 PM  Result Value Ref Range Status   Specimen Description BLOOD LEFT ARM  Final   Special Requests BOTTLES DRAWN AEROBIC AND ANAEROBIC 10ML  Final   Culture NO GROWTH 2 DAYS  Final   Report Status PENDING  Incomplete  Blood culture (routine x 2)     Status: None (Preliminary result)   Collection Time: 02/19/15  9:58 PM  Result Value Ref Range Status   Specimen Description BLOOD LEFT ANTECUBITAL  Final   Special Requests BOTTLES DRAWN AEROBIC AND ANAEROBIC 5ML  Final   Culture NO GROWTH 2 DAYS  Final   Report Status PENDING  Incomplete  C difficile quick scan w PCR reflex     Status: None   Collection Time: 02/21/15  3:21 AM  Result Value Ref Range Status   C Diff antigen NEGATIVE NEGATIVE Final   C Diff toxin NEGATIVE NEGATIVE Final   C Diff interpretation Negative for C. difficile  Final    RADIOLOGY:  Dg Chest 2 View  02/19/2015  CLINICAL DATA:  Acute onset of altered mental status. Confusion. Cough. Initial encounter. EXAM: CHEST  2 VIEW COMPARISON:  Chest radiograph performed 04/14/2011 FINDINGS: The lungs are well-aerated. Vascular congestion is noted. Minimally increased interstitial markings could reflect minimal interstitial edema. There is no evidence of pleural effusion or pneumothorax. The heart is borderline enlarged. The patient is status post median sternotomy, with evidence of prior CABG. No acute osseous abnormalities are seen. A moderate to large hiatal hernia is noted, partially filled with fluid and air. IMPRESSION: 1. Vascular congestion and  borderline cardiomegaly. Mildly increased interstitial markings could reflect minimal interstitial edema. 2. Moderate to large hiatal hernia, partially filled with fluid and air. Electronically Signed   By: Garald Balding M.D.   On: 02/19/2015 23:16   Ct Head Wo Contrast  02/19/2015  CLINICAL DATA:  Acute onset of confusion. Altered mental status. Initial encounter. EXAM: CT HEAD WITHOUT CONTRAST TECHNIQUE: Contiguous axial images were obtained from the base of the skull through the vertex without intravenous contrast. COMPARISON:  None. FINDINGS: There is no evidence of acute infarction, mass lesion, or intra- or extra-axial hemorrhage  on CT. Prominence of the ventricles and sulci reflects mild to moderate cortical volume loss. Mild cerebellar atrophy is noted. Scattered periventricular and subcortical white matter change likely reflects small vessel ischemic microangiopathy. Small chronic lacunar infarcts are noted at the left basal ganglia. The brainstem and fourth ventricle are within normal limits. The cerebral hemispheres demonstrate grossly normal gray-white differentiation. No mass effect or midline shift is seen. There is no evidence of fracture; visualized osseous structures are unremarkable in appearance. The visualized portions of the orbits are within normal limits. The paranasal sinuses and mastoid air cells are well-aerated. No significant soft tissue abnormalities are seen. IMPRESSION: 1. No acute intracranial pathology seen on CT. 2. Mild to moderate cortical volume loss and scattered small vessel ischemic microangiopathy. 3. Small chronic lacunar infarcts at the left basal ganglia. Electronically Signed   By: Garald Balding M.D.   On: 02/19/2015 23:20      Management plans discussed with the patient, family and they are in agreement.  CODE STATUS:     Code Status Orders        Start     Ordered   02/20/15 0153  Full code   Continuous     02/20/15 0152    Advance Directive  Documentation        Most Recent Value   Type of Advance Directive  Healthcare Power of Attorney, Living will   Pre-existing out of facility DNR order (yellow form or pink MOST form)     "MOST" Form in Place?        TOTAL TIME TAKING CARE OF THIS PATIENT: 40 minutes.    Henreitta Leber M.D on 02/21/2015 at 4:56 PM  Between 7am to 6pm - Pager - [phone]  After 6pm go to [email or web address] - password [email or web address] [facility]  [place] [facility]  Office  [phone]  CC: Primary care physician; No primary care provider on file.

## 2015-02-21 NOTE — Progress Notes (Signed)
A&O with periods of confusion. Incontinent. Had multiple episodes of loose stools. Notified Dr. Allena KatzPatel. Cdiff negative. IV antibiotics.

## 2015-02-21 NOTE — Clinical Documentation Improvement (Signed)
Internal Medicine  Per eval by Registered Dietician 02/20/15: "Skin: stage II pressure ulcer on buttock".  If you agree please to next Note and Discharge Summary for chart continuity.  Please exercise your independent, professional judgment when responding. A specific answer is not anticipated or expected.   Thank You, Beverley FiedlerLaurie E Aanchal Cope RN [facility] Information [facility] [place] [phone]

## 2015-02-21 NOTE — Care Management (Signed)
Spoke with Evan Vasquez and daughter, Sharen HeckBonnie Wagoner ([phone]) at the bedside. Evan Vasquez has been a resident of [facility] 3 years since last October. Last seen Dr. Harrington Challengerhies July 6th, appointment was scheduled for today. Daughter called and cancelled this appointment. No skilled facility. No home oxygen. 2 rolling walkers and a cane is used to aid in ambulation. No falls. Good appetite, until lately. Just finished [facility]. Takes care of all basic activities of daily living himself Physical therapy evaluation completed. Recommends home with home health and physical therapy. Spoke with Evan Vasquez and daughter at the bedside. Would like to [facility] with [facility]. No nursing services. Daughter will transport and also take discharge information by the office to re-establish services thru [facility]. Gwenette GreetBrenda S Nitara Szczerba RN MSN CCM Care Management [phone]

## 2015-02-21 NOTE — Progress Notes (Signed)
Pt to be discharged back to [facility] this pm. Iv removed. disch instructions given to daughter to her understanding. disch via w.c.. Daughter will transport.

## 2015-02-22 ENCOUNTER — Encounter: Payer: Self-pay | Admitting: Internal Medicine

## 2015-02-24 LAB — CULTURE, BLOOD (ROUTINE X 2)
CULTURE: NO GROWTH
Culture: NO GROWTH

## 2015-02-26 LAB — LEGIONELLA PNEUMOPHILA SEROGP 1 UR AG: L. pneumophila Serogp 1 Ur Ag: NEGATIVE

## 2015-05-18 ENCOUNTER — Emergency Department
Admission: EM | Admit: 2015-05-18 | Discharge: 2015-05-18 | Disposition: A | Discharge status: Home / Self Care | Payer: Medicare Other | Attending: Emergency Medicine | Admitting: Emergency Medicine

## 2015-05-18 ENCOUNTER — Encounter: Payer: Self-pay | Admitting: Emergency Medicine

## 2015-05-18 DIAGNOSIS — Z7982 Long term (current) use of aspirin: Secondary | ICD-10-CM | POA: Diagnosis not present

## 2015-05-18 DIAGNOSIS — I10 Essential (primary) hypertension: Secondary | ICD-10-CM | POA: Insufficient documentation

## 2015-05-18 DIAGNOSIS — E119 Type 2 diabetes mellitus without complications: Secondary | ICD-10-CM | POA: Insufficient documentation

## 2015-05-18 DIAGNOSIS — Z79899 Other long term (current) drug therapy: Secondary | ICD-10-CM | POA: Diagnosis not present

## 2015-05-18 DIAGNOSIS — E785 Hyperlipidemia, unspecified: Secondary | ICD-10-CM | POA: Diagnosis not present

## 2015-05-18 DIAGNOSIS — F0390 Unspecified dementia without behavioral disturbance: Secondary | ICD-10-CM | POA: Insufficient documentation

## 2015-05-18 DIAGNOSIS — R350 Frequency of micturition: Secondary | ICD-10-CM | POA: Diagnosis not present

## 2015-05-18 DIAGNOSIS — I4891 Unspecified atrial fibrillation: Secondary | ICD-10-CM | POA: Insufficient documentation

## 2015-05-18 LAB — URINALYSIS COMPLETE WITH MICROSCOPIC (ARMC ONLY)
BACTERIA UA: NONE SEEN
BILIRUBIN URINE: NEGATIVE
Glucose, UA: NEGATIVE mg/dL
HGB URINE DIPSTICK: NEGATIVE
Ketones, ur: NEGATIVE mg/dL
LEUKOCYTES UA: NEGATIVE
NITRITE: NEGATIVE
PH: 6 (ref 5.0–8.0)
PROTEIN: NEGATIVE mg/dL
Specific Gravity, Urine: 1.005 (ref 1.005–1.030)

## 2015-05-18 MED ORDER — OXYBUTYNIN CHLORIDE 5 MG PO TABS: 2.5000 mg | ORAL_TABLET | Freq: Three times a day (TID) | ORAL | Status: DC

## 2015-05-18 NOTE — ED Notes (Signed)
Per patient, I have been urinating a lot.  Pt. Daughter with pt. States" he was fine last night at around 7pm.

## 2015-05-18 NOTE — Discharge Instructions (Signed)
Return to the ER for worsening symptoms, persistent vomiting, fever or other concerns.  Urinary Frequency The number of times a normal person urinates depends upon how much liquid they take in and how much liquid they are losing. If the temperature is hot and there is high humidity, then the person will sweat more and usually breathe a little more frequently. These factors decrease the amount of frequency of urination that would be considered normal. The amount you drink is easily determined, but the amount of fluid lost is sometimes more difficult to calculate.  Fluid is lost in two ways:  Sensible fluid loss is usually measured by the amount of urine that you get rid of. Losses of fluid can also occur with diarrhea.  Insensible fluid loss is more difficult to measure. It is caused by evaporation. Insensible loss of fluid occurs through breathing and sweating. It usually ranges from a little less than a quart to a little more than a quart of fluid a day. In normal temperatures and activity levels, the average person may urinate 4 to 7 times in a 24-hour period. Needing to urinate more often than that could indicate a problem. If one urinates 4 to 7 times in 24 hours and has large volumes each time, that could indicate a different problem from one who urinates 4 to 7 times a day and has small volumes. The time of urinating is also important. Most urinating should be done during the waking hours. Getting up at night to urinate frequently can indicate some problems. CAUSES  The bladder is the organ in your lower abdomen that holds urine. Like a balloon, it swells some as it fills up. Your nerves sense this and tell you it is time to head for the bathroom. There are a number of reasons that you might feel the need to urinate more often than usual. They include:  Urinary tract infection. This is usually associated with other signs such as burning when you urinate.  In men, problems with the prostate (a  walnut-size gland that is located near the tube that carries urine out of your body). There are two reasons why the prostate can cause an increased frequency of urination:  An enlarged prostate that does not let the bladder empty well. If the bladder only half empties when you urinate, then it only has half the capacity to fill before you have to urinate again.  The nerves in the bladder become more hypersensitive with an increased size of the prostate even if the bladder empties completely.  Pregnancy.  Obesity. Excess weight is more likely to cause a problem for women than for men.  Bladder stones or other bladder problems.  Caffeine.  Alcohol.  Medications. For example, drugs that help the body get rid of extra fluid (diuretics) increase urine production. Some other medicines must be taken with lots of fluids.  Muscle or nerve weakness. This might be the result of a spinal cord injury, a stroke, multiple sclerosis, or Parkinson disease.  Long-standing diabetes can decrease the sensation of the bladder. This loss of sensation makes it harder to sense the bladder needs to be emptied. Over a period of years, the bladder is stretched out by constant overfilling. This weakens the bladder muscles so that the bladder does not empty well and has less capacity to fill with new urine.  Interstitial cystitis (also called painful bladder syndrome). This condition develops because the tissues that line the inside of the bladder are inflamed (inflammation is  the body's way of reacting to injury or infection). It causes pain and frequent urination. It occurs in women more often than in men. DIAGNOSIS   To decide what might be causing your urinary frequency, your health care provider will probably:  Ask about symptoms you have noticed.  Ask about your overall health. This will include questions about any medications you are taking.  Do a physical examination.  Order some tests. These might  include:  A blood test to check for diabetes or other health issues that could be contributing to the problem.  Urine testing. This could measure the flow of urine and the pressure on the bladder.  A test of your neurological system (the brain, spinal cord, and nerves). This is the system that senses the need to urinate.  A bladder test to check whether it is emptying completely when you urinate.  Cystoscopy. This test uses a thin tube with a tiny camera on it. It offers a look inside your urethra and bladder to see if there are problems.  Imaging tests. You might be given a contrast dye and then asked to urinate. X-rays are taken to see how your bladder is working. TREATMENT  It is important for you to be evaluated to determine if the amount or frequency that you have is unusual or abnormal. If it is found to be abnormal, the cause should be determined and this can usually be found out easily. Depending upon the cause, treatment could include medication, stimulation of the nerves, or surgery. There are not too many things that you can do as an individual to change your urinary frequency. It is important that you balance the amount of fluid intake needed to compensate for your activity and the temperature. Medical problems will be diagnosed and taken care of by your physician. There is no particular bladder training such as Kegel exercises that you can do to help urinary frequency. This is an exercise that is usually recommended for people who have leaking of urine when they laugh, cough, or sneeze. HOME CARE INSTRUCTIONS   Take any medications your health care provider prescribed or suggested. Follow the directions carefully.  Practice any lifestyle changes that are recommended. These might include:  Drinking less fluid or drinking at different times of the day. If you need to urinate often during the night, for example, you may need to stop drinking fluids early in the evening.  Cutting  down on caffeine or alcohol. They both can make you need to urinate more often than normal. Caffeine is found in coffee, tea, and sodas.  Losing weight, if that is recommended.  Keep a journal or a log. You might be asked to record how much you drink and when and where you feel the need to urinate. This will also help evaluate how well the treatment provided by your physician is working. SEEK MEDICAL CARE IF:   Your need to urinate often gets worse.  You feel increased pain or irritation when you urinate.  You notice blood in your urine.  You have questions about any medications that your health care provider recommended.  You notice blood, pus, or swelling at the site of any test or treatment procedure.  You develop a fever of more than 100.8F (38.1C). SEEK IMMEDIATE MEDICAL CARE IF:  You develop a fever of more than 102.89F (38.9C).   This information is not intended to replace advice given to you by your health care provider. Make sure you discuss any questions  you have with your health care provider.   Document Released: 11/28/2008 Document Revised: 02/22/2014 Document Reviewed: 11/28/2008 Elsevier Interactive Patient Education [employer]

## 2015-05-18 NOTE — ED Provider Notes (Signed)
[facility] Emergency Department Provider Note  ____________________________________________  Time seen: Approximately 6:27 AM  I have reviewed the triage vital signs and the nursing notes.   HISTORY  Chief Complaint Urinary Frequency    HPI JAXTON CASALE is a 82 y.o. male who presents [facility] the ED [facility] with a chief complain of urinary frequency.Patient has a history of urinary frequency and complains of urinating more than usual this evening. States his doctors don't know what to do with him and have advised him to stop drinking water in the evenings. Reports on average she gets up 6-8 times per night to urinate. Denies associated fever, chills, chest pain, shortness of breath, abdominal pain, nausea, vomiting, diarrhea. Denies dysuria, testicular pain or swelling. Denies recent travel or trauma. Nothing makes his symptoms better or worse. Report baseline blood sugars in the 120s.   Past Medical History  Diagnosis Date  . A-fib (HCC)   . Diabetes (HCC)   . Hypertension   . Hyperlipidemia   . Dementia     Patient Active Problem List   Diagnosis Date Noted  . Pressure ulcer 02/20/2015  . Healthcare-associated pneumonia 02/19/2015  . Hyponatremia 02/19/2015    Past Surgical History  Procedure Laterality Date  . Other surgical history      could not be obtained  . Cardiac surgery      Current Outpatient Rx  Name  Route  Sig  Dispense  Refill  . aspirin EC 81 MG tablet   Oral   Take 81 mg by mouth daily.         Marland Kitchen atorvastatin (LIPITOR) 10 MG tablet   Oral   Take 10 mg by mouth daily.         Marland Kitchen azelastine (ASTELIN) 0.1 % nasal spray   Each Nare   Place 1 spray into both nostrils 2 (two) times daily. Use in each nostril as directed         . cilostazol (PLETAL) 50 MG tablet   Oral   Take 50 mg by mouth 2 (two) times daily.         . fluticasone (FLONASE) 50 MCG/ACT nasal spray   Each Nare   Place 1 spray into both  nostrils daily.         Marland Kitchen ipratropium (ATROVENT) 0.03 % nasal spray   Each Nare   Place 2 sprays into both nostrils 4 (four) times daily. As needed         . loperamide (IMODIUM) 2 MG capsule   Oral   Take 1 capsule (2 mg total) by mouth every 6 (six) hours as needed for diarrhea or loose stools.   30 capsule   0   . metoprolol tartrate (LOPRESSOR) 25 MG tablet   Oral   Take 25 mg by mouth 2 (two) times daily.         . ramipril (ALTACE) 2.5 MG capsule   Oral   Take 2.5 mg by mouth daily.           Allergies Review of patient's allergies indicates no known allergies.  Family History  Problem Relation Age of Onset  . Family history unknown: Yes    Social History Social History  Substance Use Topics  . Smoking status: Never Smoker   . Smokeless tobacco: None  . Alcohol Use: No    Review of Systems  Constitutional: No fever/chills. Eyes: No visual changes. ENT: No sore throat. Cardiovascular: Denies chest pain. Respiratory:  Denies shortness of breath. Gastrointestinal: No abdominal pain.  No nausea, no vomiting.  No diarrhea.  No constipation. Genitourinary: Positive for urinary frequency. Negative for dysuria. Musculoskeletal: Negative for back pain. Skin: Negative for rash. Neurological: Negative for headaches, focal weakness or numbness.  10-point ROS otherwise negative.  ____________________________________________   PHYSICAL EXAM:  VITAL SIGNS: ED Triage Vitals  Enc Vitals Group     BP 05/18/15 0456 115/83 mmHg     Pulse Rate 05/18/15 0456 82     Resp 05/18/15 0456 18     Temp 05/18/15 0456 97.9 F (36.6 C)     Temp Source 05/18/15 0456 Oral     SpO2 05/18/15 0449 95 %     Weight 05/18/15 0456 130 lb (58.968 kg)     Height 05/18/15 0456 6\' 1"  (1.854 m)     Head Cir --      Peak Flow --      Pain Score --      Pain Loc --      Pain Edu? --      Excl. in GC? --     Constitutional: Alert and oriented. Well appearing and in no acute  distress. Eyes: Conjunctivae are normal. PERRL. EOMI. Head: Atraumatic. Nose: No congestion/rhinnorhea. Mouth/Throat: Mucous membranes are moist.  Oropharynx non-erythematous. Neck: No stridor.   Cardiovascular: Normal rate, regular rhythm. Grossly normal heart sounds.  Good peripheral circulation. Respiratory: Normal respiratory effort.  No retractions. Lungs CTAB. Gastrointestinal: Soft and nontender. No distention. No abdominal bruits. No CVA tenderness. Genitourinary: Circumcised male. No penile discharge. No testicular swelling or tenderness. 2+ cremaster reflexes. Musculoskeletal: No lower extremity tenderness nor edema.  No joint effusions. Neurologic:  Alert and oriented 3. Normal speech and language. No gross focal neurologic deficits are appreciated.  Skin:  Skin is warm, dry and intact. No rash noted. Psychiatric: Mood and affect are normal. Speech and behavior are normal.  ____________________________________________   LABS (all labs ordered are listed, but only abnormal results are displayed)  Labs Reviewed  URINALYSIS COMPLETEWITH MICROSCOPIC ([facility] ONLY) - Abnormal; Notable for the following:    Color, Urine STRAW (*)    APPearance CLEAR (*)    Squamous Epithelial / LPF 0-5 (*)    All other components within normal limits   ____________________________________________  EKG  None ____________________________________________  RADIOLOGY  None ____________________________________________   PROCEDURES  Procedure(s) performed: None  Critical Care performed: No  ____________________________________________   INITIAL IMPRESSION / ASSESSMENT AND PLAN / ED COURSE  Pertinent labs & imaging results that were available during my care of the patient were reviewed by me and considered in my medical decision making (see chart for details).  82 year old male who presents with urinary frequency; history of same. Urinalysis is clear. Patient really desires  medication for urinary frequency. I am uncomfortable placing patient on even low-dose oxybutynin secondary to anticholinergic side effects for geriatric patients. Will refer to urology. Strict return precautions given. Patient and daughter verbalize understanding and agree with plan of care. ____________________________________________   FINAL CLINICAL IMPRESSION(S) / ED DIAGNOSES  Final diagnoses:  Urinary frequency      Irean HongJade J Shawnte Demarest, MD 05/18/15 0740

## 2015-06-06 ENCOUNTER — Ambulatory Visit (INDEPENDENT_AMBULATORY_CARE_PROVIDER_SITE_OTHER): Payer: Medicare Other | Admitting: Urology

## 2015-06-06 ENCOUNTER — Encounter: Payer: Self-pay | Admitting: Urology

## 2015-06-06 VITALS — BP 138/69 | HR 65 | Ht 73.0 in | Wt 131.0 lb

## 2015-06-06 DIAGNOSIS — R350 Frequency of micturition: Secondary | ICD-10-CM | POA: Diagnosis not present

## 2015-06-06 DIAGNOSIS — N401 Enlarged prostate with lower urinary tract symptoms: Secondary | ICD-10-CM

## 2015-06-06 DIAGNOSIS — R351 Nocturia: Secondary | ICD-10-CM | POA: Diagnosis not present

## 2015-06-06 DIAGNOSIS — N138 Other obstructive and reflux uropathy: Secondary | ICD-10-CM

## 2015-06-06 LAB — URINALYSIS, COMPLETE
BILIRUBIN UA: NEGATIVE
GLUCOSE, UA: NEGATIVE
KETONES UA: NEGATIVE
LEUKOCYTES UA: NEGATIVE
NITRITE UA: NEGATIVE
Protein, UA: NEGATIVE
RBC UA: NEGATIVE
SPEC GRAV UA: 1.015 (ref 1.005–1.030)
Urobilinogen, Ur: 1 mg/dL (ref 0.2–1.0)
pH, UA: 6 (ref 5.0–7.5)

## 2015-06-06 LAB — MICROSCOPIC EXAMINATION: Bacteria, UA: NONE SEEN

## 2015-06-06 LAB — BLADDER SCAN AMB NON-IMAGING: Scan Result: 130

## 2015-06-06 MED ORDER — TAMSULOSIN HCL 0.4 MG PO CAPS: 0.4000 mg | ORAL_CAPSULE | Freq: Every day | ORAL | Status: DC

## 2015-06-06 MED ORDER — FINASTERIDE 5 MG PO TABS: 5.0000 mg | ORAL_TABLET | Freq: Every day | ORAL | Status: DC

## 2015-06-06 NOTE — Progress Notes (Signed)
06/06/2015 10:12 AM   Evan DurieJohn E Koos June 06, 1917 [id number]  Referring provider: Mickey Farberavid Thies, MD [address] [facility] [place], [place] [id number]  Chief Complaint  Patient presents with  . Urinary Frequency    new pt    HPI: Patient is a 82 year old Caucasian male who is referred to us by his PCP, Dr. Harrington Challengerhies, for nocturia.    His baseline urinary symptoms consist of frequency, urgency, dysuria, nocturia 3-4, incontinence and intermittency.  His UA is unremarkable at today's visit his PVR is 130 mL  He has not experienced gross hematuria or suprapubic pain.  Not had fevers, chills, nausea or vomiting.  I PSS score is 10/4.  His urinary symptoms became so bothersome that he sought treatment in the emergency department on 05/18/2015.      IPSS      06/06/15 0900       International Prostate Symptom Score   How often have you had the sensation of not emptying your bladder? Not at All     How often have you had to urinate less than every two hours? Less than 1 in 5 times     How often have you found you stopped and started again several times when you urinated? Not at All     How often have you found it difficult to postpone urination? About half the time     How often have you had a weak urinary stream? Less than 1 in 5 times     How often have you had to strain to start urination? Not at All     How many times did you typically get up at night to urinate? 5 Times     Total IPSS Score 10     Quality of Life due to urinary symptoms   If you were to spend the rest of your life with your urinary condition just the way it is now how would you feel about that? Mostly Disatisfied        Score:  1-7 Mild 8-19 Moderate 20-35 Severe  The patient is receiving physical therapy at his assisted-living facility.  He states he has noted an improvement in his urinary symptoms since he has started with physical therapy.     PMH: Past Medical History  Diagnosis Date  .  A-fib (HCC)   . Diabetes (HCC)   . Hypertension   . Hyperlipidemia   . Dementia   . Acid reflux   . Arthritis     Surgical History: Past Surgical History  Procedure Laterality Date  . Other surgical history      could not be obtained  . Cardiac surgery      Home Medications:    Medication List       This list is accurate as of: 06/06/15 10:12 AM.  Always use your most recent med list.               aspirin EC 81 MG tablet  Take 81 mg by mouth daily.     atorvastatin 10 MG tablet  Commonly known as:  LIPITOR  Take 10 mg by mouth daily.     azelastine 0.1 % nasal spray  Commonly known as:  ASTELIN  Place 1 spray into both nostrils 2 (two) times daily. Reported on 06/06/2015     cilostazol 50 MG tablet  Commonly known as:  PLETAL  Take 50 mg by mouth 2 (two) times daily.     finasteride  5 MG tablet  Commonly known as:  PROSCAR  Take 1 tablet (5 mg total) by mouth daily.     fluticasone 50 MCG/ACT nasal spray  Commonly known as:  FLONASE  Place 1 spray into both nostrils daily.     glimepiride 4 MG tablet  Commonly known as:  AMARYL     ipratropium 0.03 % nasal spray  Commonly known as:  ATROVENT  Place 2 sprays into both nostrils 4 (four) times daily. As needed     metoprolol tartrate 25 MG tablet  Commonly known as:  LOPRESSOR  Take 25 mg by mouth 2 (two) times daily.     ramipril 2.5 MG capsule  Commonly known as:  ALTACE  Take 2.5 mg by mouth daily.     tamsulosin 0.4 MG Caps capsule  Commonly known as:  FLOMAX  Take 1 capsule (0.4 mg total) by mouth daily.        Allergies: No Known Allergies  Family History: Family History  Problem Relation Age of Onset  . Kidney cancer Neg Hx   . Kidney disease Neg Hx   . Prostate cancer Neg Hx     Social History:  reports that he has quit smoking. He does not have any smokeless tobacco history on file. He reports that he does not drink alcohol or use illicit drugs.  ROS: UROLOGY Frequent  Urination?: Yes Hard to postpone urination?: Yes Burning/pain with urination?: Yes Get up at night to urinate?: Yes Leakage of urine?: Yes Urine stream starts and stops?: Yes Trouble starting stream?: No Do you have to strain to urinate?: No Blood in urine?: No Urinary tract infection?: No Sexually transmitted disease?: No Injury to kidneys or bladder?: No Painful intercourse?: No Weak stream?: No Erection problems?: No Penile pain?: No  Gastrointestinal Nausea?: No Vomiting?: No Indigestion/heartburn?: No Diarrhea?: Yes Constipation?: Yes  Constitutional Fever: No Night sweats?: No Weight loss?: No Fatigue?: Yes  Skin Skin rash/lesions?: Yes Itching?: No  Eyes Blurred vision?: Yes Double vision?: No  Ears/Nose/Throat Sore throat?: No Sinus problems?: Yes  Hematologic/Lymphatic Swollen glands?: No Easy bruising?: Yes  Cardiovascular Leg swelling?: Yes Chest pain?: No  Respiratory Cough?: No Shortness of breath?: No  Endocrine Excessive thirst?: No  Musculoskeletal Back pain?: Yes Joint pain?: Yes  Neurological Headaches?: No Dizziness?: Yes  Psychologic Depression?: No Anxiety?: No  Physical Exam: BP 138/69 mmHg  Pulse 65  Ht  (1.854 m)  Wt 131 lb (59.421 kg)  BMI 17.29 kg/m2  Constitutional: Well nourished. Alert and oriented, No acute distress. HEENT: [place] AT, moist mucus membranes. Trachea midline, no masses. Cardiovascular: No clubbing, cyanosis, or edema. Respiratory: Normal respiratory effort, no increased work of breathing. GI: Abdomen is soft, non tender, non distended, no abdominal masses. Liver and spleen not palpable.  No hernias appreciated.  Stool sample for occult testing is not indicated.   GU: No CVA tenderness.  No bladder fullness or masses.  Patient with circumcised phallus.  Urethral meatus is patent.  No penile discharge. No penile lesions or rashes. Scrotum without lesions, cysts, rashes and/or edema.   Testicles are located scrotally bilaterally. No masses are appreciated in the testicles. Left and right epididymis are normal. Rectal: Patient with  normal sphincter tone. Anus and perineum without scarring or rashes. No rectal masses are appreciated. Prostate is approximately 60 grams, no nodules are appreciated. Seminal vesicles are normal. Skin: No rashes, bruises or suspicious lesions. Lymph: No cervical or inguinal adenopathy. Neurologic: Grossly intact, no focal  deficits, moving all 4 extremities. Psychiatric: Normal mood and affect.  Laboratory Data: Lab Results  Component Value Date   WBC 7.9 02/20/2015   HGB 13.1 02/20/2015   HCT 37.2* 02/20/2015   MCV 96.7 02/20/2015   PLT 123* 02/20/2015    Lab Results  Component Value Date   CREATININE 0.91 02/20/2015    Lab Results  Component Value Date   TSH 0.970 04/14/2011     Lab Results  Component Value Date   AST 36 02/19/2015   Lab Results  Component Value Date   ALT 26 02/19/2015    Urinalysis Results for orders placed or performed in visit on 06/06/15  Microscopic Examination  Result Value Ref Range   WBC, UA 0-5 0 -  5 /hpf   RBC, UA 0-2 0 -  2 /hpf   Epithelial Cells (non renal) 0-10 0 - 10 /hpf   Bacteria, UA None seen None seen/Few  Urinalysis, Complete  Result Value Ref Range   Specific Gravity, UA 1.015 1.005 - 1.030   pH, UA 6.0 5.0 - 7.5   Color, UA Yellow Yellow   Appearance Ur Clear Clear   Leukocytes, UA Negative Negative   Protein, UA Negative Negative/Trace   Glucose, UA Negative Negative   Ketones, UA Negative Negative   RBC, UA Negative Negative   Bilirubin, UA Negative Negative   Urobilinogen, Ur 1.0 0.2 - 1.0 mg/dL   Nitrite, UA Negative Negative   Microscopic Examination See below:   BLADDER SCAN AMB NON-IMAGING  Result Value Ref Range   Scan Result 130     Pertinent Imaging: Results for MEAGAN, SPEASE (MRN [id number]) as of 06/06/2015 10:05  Ref. Range 06/06/2015 09:37  Scan  Result Unknown 130    Assessment & Plan:    1. Urinary frequency:   Patient has a moderate PVR.  I will prescribe Flomax and finasteride at this time. He'll return in 3 months for I PSS and PVR.  - Urinalysis, Complete - BLADDER SCAN AMB NON-IMAGING  2. BPH with LUTS:   Patient's IPSS score is 10/4.  His PVR 130 mL.  His DRE demonstrates benign enlargement.  I discussed the treatment options for BPH, such as: observation over time with prn avoidance of alcohol/caffeine and medical treatment.  Patient would like to pursue medical treatment.  I will start tamsulosin 0.4 mg daily and asked tried 5 mg daily.  He is advised to take the medicine  30 minutes after a meal.  I advised him of the side effects, such as: retrograde ejaculation, sinus congestion, nasal congestion, rhinorrhea, rhinitis, dizziness, and seasonal allergic rhinitis.  The side effects of master had are also discussed with the patient, such as: impotence, loss of interest in sex, or trouble having an orgasm; abnormal ejaculation; swelling in his hands and/or feet; swelling and/or tenderness in his breasts.  He will follow up in 3 months for a PVR and an IPSS.    3. Nocturia:   I explained to the patient that nocturia is often multi-factorial and difficult to treat.  Sleeping disorders, heart conditions and peripheral vascular disease, diabetes,  enlarged prostate or urethral stricture causing bladder outlet obstruction and/or certain medications.  I have suggested that the patient avoid caffeine and alcohol in the evening. He may also benefit from fluid restrictions after 6:00 in the evening and voiding just prior to bedtime.  We will readdress when he returns in 3 months.  Return in about 3 months (around 09/05/2015) for IPSS  and PVR.  These notes generated with voice recognition software. I apologize for typographical errors.  Michiel Cowboy, PA-C  [facility] [address] [place], [place]  [address] [phone]  xc

## 2015-06-06 NOTE — Patient Instructions (Signed)
Tamsulosin may cause a runny nose and/or low blood pressure making you feel light-headed and/or dizziness.  If this happens, you may stop this medication.

## 2015-06-09 DIAGNOSIS — R350 Frequency of micturition: Secondary | ICD-10-CM | POA: Insufficient documentation

## 2015-06-09 DIAGNOSIS — N138 Other obstructive and reflux uropathy: Secondary | ICD-10-CM | POA: Insufficient documentation

## 2015-06-09 DIAGNOSIS — N401 Enlarged prostate with lower urinary tract symptoms: Secondary | ICD-10-CM

## 2015-06-09 DIAGNOSIS — R351 Nocturia: Secondary | ICD-10-CM | POA: Insufficient documentation

## 2015-09-04 ENCOUNTER — Ambulatory Visit: Payer: Medicare Other | Admitting: Urology

## 2015-11-22 ENCOUNTER — Encounter: Payer: Self-pay | Admitting: *Deleted

## 2015-11-22 ENCOUNTER — Emergency Department: Payer: Medicare Other

## 2015-11-22 ENCOUNTER — Emergency Department
Admission: EM | Admit: 2015-11-22 | Discharge: 2015-11-22 | Disposition: A | Discharge status: Home / Self Care | Payer: Medicare Other | Attending: Emergency Medicine | Admitting: Emergency Medicine

## 2015-11-22 ENCOUNTER — Encounter
Admission: RE | Admit: 2015-11-22 | Discharge: 2015-11-22 | Disposition: A | Discharge status: Home / Self Care | Payer: Medicare Other | Source: Ambulatory Visit | Attending: Internal Medicine | Admitting: Internal Medicine

## 2015-11-22 DIAGNOSIS — R2689 Other abnormalities of gait and mobility: Secondary | ICD-10-CM | POA: Insufficient documentation

## 2015-11-22 DIAGNOSIS — I10 Essential (primary) hypertension: Secondary | ICD-10-CM | POA: Insufficient documentation

## 2015-11-22 DIAGNOSIS — Z7984 Long term (current) use of oral hypoglycemic drugs: Secondary | ICD-10-CM | POA: Diagnosis not present

## 2015-11-22 DIAGNOSIS — R531 Weakness: Secondary | ICD-10-CM | POA: Insufficient documentation

## 2015-11-22 DIAGNOSIS — E119 Type 2 diabetes mellitus without complications: Secondary | ICD-10-CM | POA: Insufficient documentation

## 2015-11-22 DIAGNOSIS — Z87891 Personal history of nicotine dependence: Secondary | ICD-10-CM | POA: Insufficient documentation

## 2015-11-22 DIAGNOSIS — Z79899 Other long term (current) drug therapy: Secondary | ICD-10-CM | POA: Diagnosis not present

## 2015-11-22 DIAGNOSIS — M545 Low back pain, unspecified: Secondary | ICD-10-CM

## 2015-11-22 DIAGNOSIS — Z7982 Long term (current) use of aspirin: Secondary | ICD-10-CM | POA: Diagnosis not present

## 2015-11-22 DIAGNOSIS — E871 Hypo-osmolality and hyponatremia: Secondary | ICD-10-CM | POA: Insufficient documentation

## 2015-11-22 DIAGNOSIS — I129 Hypertensive chronic kidney disease with stage 1 through stage 4 chronic kidney disease, or unspecified chronic kidney disease: Secondary | ICD-10-CM | POA: Insufficient documentation

## 2015-11-22 DIAGNOSIS — R2681 Unsteadiness on feet: Secondary | ICD-10-CM

## 2015-11-22 LAB — BASIC METABOLIC PANEL
Anion gap: 6 (ref 5–15)
BUN: 20 mg/dL (ref 6–20)
CHLORIDE: 100 mmol/L — AB (ref 101–111)
CO2: 27 mmol/L (ref 22–32)
Calcium: 9.4 mg/dL (ref 8.9–10.3)
Creatinine, Ser: 0.86 mg/dL (ref 0.61–1.24)
GFR calc Af Amer: >60 mL/min (ref >60)
GFR calc non Af Amer: >60 mL/min (ref >60)
Glucose, Bld: 126 mg/dL — ABNORMAL HIGH (ref 65–99)
POTASSIUM: 4.5 mmol/L (ref 3.5–5.1)
SODIUM: 133 mmol/L — AB (ref 135–145)

## 2015-11-22 LAB — CBC WITH DIFFERENTIAL/PLATELET
BASOS ABS: 0.1 10*3/uL (ref 0–0.1)
BASOS PCT: 1 %
EOS ABS: 0.2 10*3/uL (ref 0–0.7)
Eosinophils Relative: 2 %
HEMATOCRIT: 41.4 % (ref 40.0–52.0)
Hemoglobin: 14 g/dL (ref 13.0–18.0)
Lymphocytes Relative: 9 %
Lymphs Abs: 0.7 10*3/uL — ABNORMAL LOW (ref 1.0–3.6)
MCH: 33.3 pg (ref 26.0–34.0)
MCHC: 33.7 g/dL (ref 32.0–36.0)
MCV: 98.8 fL (ref 80.0–100.0)
MONO ABS: 0.9 10*3/uL (ref 0.2–1.0)
MONOS PCT: 11 %
NEUTROS ABS: 6.6 10*3/uL — AB (ref 1.4–6.5)
Neutrophils Relative %: 77 %
PLATELETS: 211 10*3/uL (ref 150–440)
RBC: 4.18 MIL/uL — ABNORMAL LOW (ref 4.40–5.90)
RDW: 13.4 % (ref 11.5–14.5)
WBC: 8.4 10*3/uL (ref 3.8–10.6)

## 2015-11-22 LAB — URINALYSIS COMPLETE WITH MICROSCOPIC (ARMC ONLY)
BILIRUBIN URINE: NEGATIVE
Bacteria, UA: NONE SEEN
GLUCOSE, UA: NEGATIVE mg/dL
HGB URINE DIPSTICK: NEGATIVE
KETONES UR: NEGATIVE mg/dL
LEUKOCYTES UA: NEGATIVE
NITRITE: NEGATIVE
PH: 7 (ref 5.0–8.0)
Protein, ur: NEGATIVE mg/dL
SPECIFIC GRAVITY, URINE: 1.009 (ref 1.005–1.030)
Squamous Epithelial / LPF: NONE SEEN
WBC, UA: NONE SEEN WBC/hpf (ref 0–5)

## 2015-11-22 LAB — GLUCOSE, CAPILLARY: Glucose-Capillary: 150 mg/dL — ABNORMAL HIGH (ref 65–99)

## 2015-11-22 NOTE — Progress Notes (Signed)
Patient has been accepted and EDP and ED secretary and ED nurse notified patient will be going to room 222B and call report [phone].  LCSW met with patient and family and thanked them and wished them well, no further needs.  [employer] LCSW [phone]

## 2015-11-22 NOTE — Progress Notes (Signed)
LCSW consulted with EDP and family members and health care POA. It was decided that the [facility] and he would be better served in SNF as pt struggles to get up, walk or ambulate.  LCSW received [facility] from [place] [phone] admissions who has bed availablility LCSW provided both POA and Michelles number to communicate with one another and let this SW know when patient can be transported.  Awaiting call back   Kearstin Learn [place] LCSW [phone]

## 2015-11-22 NOTE — Clinical Social Work Note (Signed)
Clinical Social Work Assessment  Patient Details  Name: Evan Vasquez MRN: [id number] Date of Birth: [birth year]  Date of referral:  11/22/15               Reason for consult:  Facility Placement                Permission sought to share information with:  Family Supports, Magazine features editoracility Contact Representative Permission granted to share information::  Yes, Verbal Permission Granted  Name::     Evan Vasquez [phone] Evan Vasquez [phone] [facility]  Agency::  All facilities  Relationship::     Contact Information:    Evan Vasquez [phone] Evan Vasquez [phone] [facility]  Housing/Transportation Living arrangements for the past 2 months:  Assisted Living Facility [facility] with in home care) Source of Information:  Patient, Adult Children Patient Interpreter Needed:  None Criminal Activity/Legal Involvement Pertinent to Current Situation/Hospitalization:  No - Comment as needed Significant Relationships:  Adult Children, Community Support, Other Family Members Lives with:  Self Do you feel safe going back to the place where you live?  Yes Need for family participation in patient care:  Yes (Comment)  Care giving concerns:  Patient has been able to walk for the most part but now he has had 2 falls in 3 days unable to stand or walk unsupported  Office managerocial Worker assessment / plan: LCSW introduced herself to patient and daughter Evan Vasquez. Verbal consent was given by patient to speak to all family members and to contact all facilities as required. Patient is oriented x3 he sometimes get times and dates mixed up. He has lived [facility] with in home supportive workers and PT 3xweek  by [facility]. His family may elect to take patient home and with all family members remain with him until his preference for SNF can take him. Patient is diabetic and has macular degeneration of both eyes. He is verbal and clear and can make his requests known. Patient has granddaughter Evan Vasquez and  son Evan Vasquez who is the Health Care power of attorney. LCSW will complete Fl2/Passr and is awaitng PT consult. With patients permission will send out information to SNF for potential bed placement. He has UHC/Medicare and family is willing for private pay to ensure his safety and needs.  Employment status:  Retired Education officer, community(accountant) Insurance information:  Armed forces operational officerMedicare (United health Care) PT Recommendations:  Not assessed at this time (was getting in home PT 3x week from [facility]) Information / Referral to community resources:  Skilled Nursing Facility  Patient/Family's Response to care:  Recent falls concerning  Patient/Family's Understanding of and Emotional Response to Diagnosis, Current Treatment, and Prognosis: patient is aware he falls and feels he needs more care at this time.  Emotional Assessment Appearance:  Appears stated age Attitude/Demeanor/Rapport:   (Polite calm and oriented x4) Affect (typically observed):  Accepting, Adaptable, Calm Orientation:  Oriented to Self, Oriented to Place, Oriented to Situation sometimes has troubles with time and dates. Alcohol / Substance use:  Not Applicable Psych involvement (Current and /or in the community):  No (Comment)  Discharge Needs  Concerns to be addressed:  No discharge needs identified Readmission within the last 30 days:  No Current discharge risk:  Physical Impairment (Unable to stand or walk) Barriers to Discharge:  Continued Medical Work up   [place], Ninfa [place]eker[place]audine M, LCSW 11/22/2015, 9:50 AM

## 2015-11-22 NOTE — ED Notes (Signed)
Pt resting in bed, family at bedside, pt awake and alert 

## 2015-11-22 NOTE — ED Notes (Signed)
Attempted to walk pt, pt unsteady using walker, leaning backward, EDP at bedside, social work at bedside, diaper changed and pt cleaned

## 2015-11-22 NOTE — [place] FL2 (Signed)
[place] MEDICAID FL2 LEVEL OF CARE SCREENING TOOL     IDENTIFICATION  Patient Name: EMIEL KIELTY Birthdate: [birth year] Sex: male Admission Date (Current Location): 11/22/2015  [place] and [place] Number:  Chiropodist and Address:  [facility], [address] [place], [place] [address]      Provider Number: [id number]  Attending Physician Name and Address:  Governor Rooks, MD  Relative Name and Phone Number:  Steffanie Rainwater [phone] Melton Alar [phone] HCPOA    Current Level of Care: Hospital Recommended Level of Care: Skilled Nursing Facility Prior Approval Number:    Date Approved/Denied:   PASRR Number:   [id number]    Discharge Plan: SNF    Current Diagnoses: Patient Active Problem List   Diagnosis Date Noted  . Urinary frequency 06/09/2015  . BPH with obstruction/lower urinary tract symptoms 06/09/2015  . Nocturia 06/09/2015  . Pressure ulcer 02/20/2015  . Healthcare-associated pneumonia 02/19/2015  . Hyponatremia 02/19/2015    Orientation RESPIRATION BLADDER Height & Weight     Self, Situation, Place  Normal Incontinent Weight: 131 lb (59.4 kg) Height:  6\' 1"  (185.4 cm)  BEHAVIORAL SYMPTOMS/MOOD NEUROLOGICAL BOWEL NUTRITION STATUS      Incontinent Diet (Diabetic)  AMBULATORY STATUS COMMUNICATION OF NEEDS Skin   Extensive Assist Verbally Normal                       Personal Care Assistance Level of Assistance  Bathing, Feeding, Dressing, Total care Bathing Assistance: Limited assistance Feeding assistance: Independent Dressing Assistance: Limited assistance     Functional Limitations Info  Sight, Hearing, Speech Sight Info: Impaired (Macular Degeneration) Hearing Info: Adequate Speech Info: Adequate    SPECIAL CARE FACTORS FREQUENCY  PT (By licensed PT), OT (By licensed OT)     PT Frequency: x5 OT Frequency: x5            Contractures Contractures Info: Not present     Additional Factors Info  Code Status (Full code) Code Status Info: full             Current Medications (11/22/2015):  This is the current hospital active medication list No current facility-administered medications for this encounter.    Current Outpatient Prescriptions  Medication Sig Dispense Refill  . aspirin EC 81 MG tablet Take 81 mg by mouth daily.    Marland Kitchen atorvastatin (LIPITOR) 10 MG tablet Take 10 mg by mouth daily.    Marland Kitchen azelastine (ASTELIN) 0.1 % nasal spray Place 1 spray into both nostrils 2 (two) times daily. Reported on 06/06/2015    . cilostazol (PLETAL) 50 MG tablet Take 50 mg by mouth 2 (two) times daily.    . finasteride (PROSCAR) 5 MG tablet Take 1 tablet (5 mg total) by mouth daily. 90 tablet 3  . fluticasone (FLONASE) 50 MCG/ACT nasal spray Place 1 spray into both nostrils daily.    Marland Kitchen glimepiride (AMARYL) 4 MG tablet     . ipratropium (ATROVENT) 0.03 % nasal spray Place 2 sprays into both nostrils 4 (four) times daily. As needed    . metoprolol tartrate (LOPRESSOR) 25 MG tablet Take 25 mg by mouth 2 (two) times daily.    . ramipril (ALTACE) 2.5 MG capsule Take 2.5 mg by mouth daily.    . tamsulosin (FLOMAX) 0.4 MG CAPS capsule Take 1 capsule (0.4 mg total) by mouth daily. 90 capsule 3     Discharge Medications: Please see discharge summary for a list of discharge  medications.  Relevant Imaging Results:  Relevant Lab Results:   Additional Information SSN # [id number]  Cheron SchaumannBandi, Tuan Tippin M, [place]

## 2015-11-22 NOTE — Evaluation (Signed)
Physical Therapy Evaluation Patient Details Name: Evan Vasquez MRN: [id number] DOB: 02/09/1918 Today's Date: 11/22/2015   History of Present Illness  82 y/o male here with recent weakness.  Apparently he has been having multiple falls every week for months.  Clinical Impression  Pt shows good effort with PT but is quite limited.  It appears pt has probably not been safe at home over recent months, but does have family and aides to check-in and assist multiple times a day.  Pt did very poorly with attempts at standing today and showed poor balance and awareness with posterior lean and generally limited mobility.  Pt will need higher level of care and would benefit from short term rehab.  He will need 24-hr assist what ever his discharge disposition.     Follow Up Recommendations SNF    Equipment Recommendations       Recommendations for Other Services       Precautions / Restrictions Precautions Precautions: Fall Restrictions Weight Bearing Restrictions: No      Mobility  Bed Mobility Overal bed mobility: Needs Assistance Bed Mobility: Supine to Sit;Sit to Supine     Supine to sit: Mod assist Sit to supine: Mod assist   General bed mobility comments: Pt with great effort getting in/out of bed but overall struggled and needed assist to get to upright  Transfers Overall transfer level: Needs assistance Equipment used: Rolling walker (2 wheeled) Transfers: Sit to/from Stand Sit to Stand: Mod assist         General transfer comment: Pt shows very good effort but is unable to shift his weight forward and ultimately is unsteady/unsafe.  He leans backward and even with heavy cuing and phyiscal assist is unable to shift weight forward.  Ambulation/Gait             General Gait Details: even with attempts at small steps at EOB pt is unable and falling backward - unable/unsafe to ambulate  Stairs            Wheelchair Mobility    Modified Rankin (Stroke  Patients Only)       Balance Overall balance assessment: Needs assistance Sitting-balance support: Bilateral upper extremity supported Sitting balance-Leahy Scale: Fair Sitting balance - Comments: pt leaning backward and generally unable to keep himself consistently upright Postural control: Posterior lean Standing balance support: Bilateral upper extremity supported Standing balance-Leahy Scale: Zero Standing balance comment: Pt very much struggling to keep his feet under himself, heavy assist from PT to stay upright unable to get weight forward on toes/walker                             Pertinent Vitals/Pain Pain Assessment:  (not bad, apparently has had been having some LE pain)    Home Living Family/patient expects to be discharged to:: Assisted living (with transition to SNF in the very near future)               Home Equipment: Dan Humphreys - 2 wheels;Walker - 4 wheels;Cane - single point      Prior Function Level of Independence: Independent with assistive device(s)         Comments: Pt apparently has been having a harder and harder time getting down to meals, going out with family, etc     Hand Dominance        Extremity/Trunk Assessment   Upper Extremity Assessment: Generalized weakness (age appropriate ROM deficits, 3+ to 4-/5  strength)           Lower Extremity Assessment: Generalized weakness (age appropriate deficits, poor quality of motion t/o)         Communication   Communication: HOH  Cognition Arousal/Alertness: Awake/alert Behavior During Therapy: Impulsive Overall Cognitive Status: Within Functional Limits for tasks assessed (daughter reports he's at his baseline somewhat unpredictable)                      General Comments      Exercises     Assessment/Plan    PT Assessment Patient needs continued PT services  PT Problem List Decreased strength;Decreased balance;Decreased activity tolerance;Decreased  mobility;Decreased knowledge of use of DME;Decreased safety awareness;Decreased coordination;Decreased range of motion          PT Treatment Interventions DME instruction;Gait training;Stair training;Functional mobility training;Therapeutic activities;Therapeutic exercise;Balance training;Cognitive remediation;Neuromuscular re-education;Patient/family education    PT Goals (Current goals can be found in the Care Plan section)  Acute Rehab PT Goals Patient Stated Goal: get into nursing facility PT Goal Formulation: With patient Time For Goal Achievement: 12/06/15 Potential to Achieve Goals: Fair    Frequency Min 2X/week   Barriers to discharge        Co-evaluation               End of Session Equipment Utilized During Treatment: Gait belt Activity Tolerance: Patient tolerated treatment well Patient left: with family/visitor present;in bed;with call bell/phone within reach Nurse Communication: Mobility status    Functional Assessment Tool Used: clinical judgement Functional Limitation: Mobility: Walking and moving around Mobility: Walking and Moving Around Current Status ([id number](G8978): At least 80 percent but less than 100 percent impaired, limited or restricted Mobility: Walking and Moving Around Goal Status [phone](G8979): At least 20 percent but less than 40 percent impaired, limited or restricted    Time: 1210-1235 PT Time Calculation (min) (ACUTE ONLY): 25 min   Charges:   PT Evaluation $PT Eval Low Complexity: 1 Procedure     PT G Codes:   PT G-Codes **NOT FOR INPATIENT CLASS** Functional Assessment Tool Used: clinical judgement Functional Limitation: Mobility: Walking and moving around Mobility: Walking and Moving Around Current Status (Q6578(G8978): At least 80 percent but less than 100 percent impaired, limited or restricted Mobility: Walking and Moving Around Goal Status [phone]([id number]): At least 20 percent but less than 40 percent impaired, limited or restricted    Malachi ProGalen R  Dewie Ahart, DPT 11/22/2015, 2:08 PM

## 2015-11-22 NOTE — Progress Notes (Signed)
LCSW consulted with EDR and patient has been accepted to go to [facility] today.  Faxed over signed Fl2 and [facility]. The HCPOA will be arriving to sign documentation at 3:30pm and patient can be transported after 3:30pm  EMS transport to be arranged by ED secretary- Christen Bameonnie notified.  ED Nurse given call report number/room number  [facility] Room 222B or 217  Call Report number [phone]  Arrie SenateClaudine Jerrel Tiberio LCSW [phone]

## 2015-11-22 NOTE — ED Notes (Signed)
Patient transported to CT 

## 2015-11-22 NOTE — ED Triage Notes (Signed)
Pt arrives via EMS from [facility], pt had a fall last night and was put back in bed, caregiver states this AM pt has been complaining of bilateral leg pain, hip pain and back pain, EMS states pt was unable to stand for them, pt uses walker at baseline, pt has wound on left leg and bilateral lower extremity edema which is chronic per pt and caregiver, pt states leg pain and hip pain is not new either, pt awake and alert upon arrival

## 2015-11-22 NOTE — ED Notes (Signed)
Pt returned from xray, resting in bed 

## 2015-11-22 NOTE — ED Notes (Addendum)
Pt transported to [facility] by [facility]. Discharge instructions sent with EMS and report called to [facility] [phone].  NAD at this time.

## 2015-11-22 NOTE — Progress Notes (Signed)
LCSW follwed up with patient and family. Patient just had a CT scan and still awaits a PT consult. The family plan is to return [facility] with in home supports and LCSW gave them PASSR number for future SNF placement. So far only one SNF [facility] stated they would accept him but when notified that no new admissions until Monday when the director is in.  Will continue to follow this patient and family until d/c.  Delta Air LinesClaudine Joyleen Haselton LCSW [phone]

## 2015-11-22 NOTE — Discharge Instructions (Addendum)
You were evaluated after multiple falls, and also no serious injury was found and your laboratory and urine studies are reassuring, you are unable to stand or your own weight for walking, and so we discussed having you evaluated for nursing home placement.  [facility].  Return to the emergency department for any worsening condition causing trouble breathing, chest pain, focal weakness or numbness, or any other symptoms concerning to you.

## 2015-11-22 NOTE — Progress Notes (Addendum)
LCSW consulted with Evan Vasquez, Patients Grand daughter/HCPOA and she would like her Grand father to return [facility] and has instructed her Mother and other family members to assist in providing 24 hour care. LCSW family list for additional in-home support, plus patient currently has in home supports as follows.  Care taker- comes in the am to support patient with medications, Home assistant 3x week to shower patient Every evening home assistant comes in changes, hand washes and places patient in bed at night. Daughter and grand daughter who is a Licensed conveyancernursing tech, will provide additional in home support until patient can be relocated [facility] to SNF. Patient has already started the SNF and prefers [facility].   LCSW consulted with EDP, completed a PASSR  [id number] A    request,Fl2 and sent out information to several facilities and will present this to family. Patient will be discharging back [facility] once PT consult is completed.  Delta Air LinesClaudine Macklyn Glandon LCSW [phone]

## 2015-11-22 NOTE — ED Notes (Signed)
Pt resting in bed, daughter at bedside.  

## 2015-11-22 NOTE — ED Provider Notes (Signed)
[facility] Emergency Department Provider Note ____________________________________________   I have reviewed the triage vital signs and the triage nursing note.  HISTORY  Chief Complaint Leg Pain and Back Pain   Historian Patient and daughter  HPI MATAN STEEN is a 82 y.o. male who lives alone essentially at assisted living [facility], and has been having a generalized decline in terms of his strength over the last few months, but over the last 2 nights he's had 2 falls and is having trouble getting around at all. Denies any headache, fever, trouble breathing, chest pain, one-sided weakness or numbness, but states that he slipped a bed last night and then this morning he also wasn't able to get his own weight out of bed and slipped down onto his buttock area. He is complaining of some soreness into both hips and his low back. Pain is mild to moderate. No new numbness or tingling, he states he has some chronic paresthesias to both lower extremities. He has a chronic wound to the left lower extremity which is being watched and wrapped by his primary care physician's office.  Daughter states that they are in the process of considering getting him into a nursing home, but she anticipates is probably a few more weeks working through the primary physician's office for this.    Past Medical History:  Diagnosis Date  . A-fib (HCC)   . Acid reflux   . Arthritis   . Dementia   . Diabetes (HCC)   . Hyperlipidemia   . Hypertension     Patient Active Problem List   Diagnosis Date Noted  . Urinary frequency 06/09/2015  . BPH with obstruction/lower urinary tract symptoms 06/09/2015  . Nocturia 06/09/2015  . Pressure ulcer 02/20/2015  . Healthcare-associated pneumonia 02/19/2015  . Hyponatremia 02/19/2015    Past Surgical History:  Procedure Laterality Date  . CARDIAC SURGERY    . OTHER SURGICAL HISTORY     could not be obtained    Prior to Admission  medications   Medication Sig Start Date End Date Taking? Authorizing Provider  aspirin EC 81 MG tablet Take 81 mg by mouth daily.    Historical Provider, MD  atorvastatin (LIPITOR) 10 MG tablet Take 10 mg by mouth daily.    Historical Provider, MD  azelastine (ASTELIN) 0.1 % nasal spray Place 1 spray into both nostrils 2 (two) times daily. Reported on 06/06/2015    Historical Provider, MD  cilostazol (PLETAL) 50 MG tablet Take 50 mg by mouth 2 (two) times daily.    Historical Provider, MD  finasteride (PROSCAR) 5 MG tablet Take 1 tablet (5 mg total) by mouth daily. 06/06/15   Carollee Herter A McGowan, PA-C  fluticasone (FLONASE) 50 MCG/ACT nasal spray Place 1 spray into both nostrils daily.    Historical Provider, MD  glimepiride (AMARYL) 4 MG tablet  04/29/15   Historical Provider, MD  ipratropium (ATROVENT) 0.03 % nasal spray Place 2 sprays into both nostrils 4 (four) times daily. As needed    Historical Provider, MD  metoprolol tartrate (LOPRESSOR) 25 MG tablet Take 25 mg by mouth 2 (two) times daily.    Historical Provider, MD  ramipril (ALTACE) 2.5 MG capsule Take 2.5 mg by mouth daily.    Historical Provider, MD  tamsulosin (FLOMAX) 0.4 MG CAPS capsule Take 1 capsule (0.4 mg total) by mouth daily. 06/06/15   Harle Battiest, PA-C    No Known Allergies  Family History  Problem Relation Age of  Onset  . Kidney cancer Neg Hx   . Kidney disease Neg Hx   . Prostate cancer Neg Hx     Social History Social History  Substance Use Topics  . Smoking status: Former Games developer  . Smokeless tobacco: Not on file  . Alcohol use No    Review of Systems  Constitutional: Negative for fever. Eyes: Negative for visual changes. ENT: Negative for sore throat. Cardiovascular: Negative for chest pain. Respiratory: Negative for shortness of breath. Gastrointestinal: Negative for abdominal pain, vomiting and diarrhea. Genitourinary: Negative for dysuria. Musculoskeletal: Positive for back pain. Skin:  Negative for rash. Neurological: Negative for headache. 10 point Review of Systems otherwise negative ____________________________________________   PHYSICAL EXAM:  VITAL SIGNS: ED Triage Vitals  Enc Vitals Group     BP 11/22/15 0713 (!) 167/69     Pulse Rate 11/22/15 0713 71     Resp 11/22/15 0713 18     Temp 11/22/15 0713 97.6 F (36.4 C)     Temp Source 11/22/15 0713 Oral     SpO2 11/22/15 0710 96 %     Weight 11/22/15 0713 131 lb (59.4 kg)     Height 11/22/15 0713 6\' 1"  (1.854 m)     Head Circumference --      Peak Flow --      Pain Score 11/22/15 0713 4     Pain Loc --      Pain Edu? --      Excl. in GC? --      Constitutional: Alert and oriented, cooperative. Well appearing and in no distress. HEENT   Head: Normocephalic and atraumatic.      Eyes: Conjunctivae are normal. PERRL. Normal extraocular movements.      Ears:         Nose: No congestion/rhinnorhea.   Mouth/Throat: Mucous membranes are moist.   Neck: No stridor. Cardiovascular/Chest: Normal rate, regular rhythm.  No murmurs, rubs, or gallops. Respiratory: Normal respiratory effort without tachypnea nor retractions. Breath sounds are clear and equal bilaterally. No wheezes/rales/rhonchi. Gastrointestinal: Soft. No distention, no guarding, no rebound. Nontender.   Genitourinary/rectal:Deferred Musculoskeletal: Low back discomfort with palpation over low lumbar area, reasonable rom of both hips with just mild soreness without significant pain elicited Neurologic:  Normal speech and language. No gross or focal neurologic deficits are appreciated. Skin:  Skin is warm, dry and intact. No rash noted. Psychiatric: Mood and affect are normal. Speech and behavior are normal.    ____________________________________________  LABS (pertinent positives/negatives)  Labs Reviewed  BASIC METABOLIC PANEL - Abnormal; Notable for the following:       Result Value   Sodium 133 (*)    Chloride 100 (*)     Glucose, Bld 126 (*)    All other components within normal limits  CBC WITH DIFFERENTIAL/PLATELET - Abnormal; Notable for the following:    RBC 4.18 (*)    Neutro Abs 6.6 (*)    Lymphs Abs 0.7 (*)    All other components within normal limits  URINALYSIS COMPLETEWITH MICROSCOPIC ([facility] ONLY) - Abnormal; Notable for the following:    Color, Urine STRAW (*)    APPearance CLEAR (*)    All other components within normal limits    ____________________________________________    EKG I, Governor Rooks, MD, the attending physician have personally viewed and interpreted all ECGs.  73 bpm. Initial fibrillation. Narrow QS. Normal axis. Nonspecific ST and T-wave ____________________________________________  RADIOLOGY All Xrays were viewed by me. Imaging interpreted by Radiologist.  Pelvis one to 2 views:IMPRESSION: No acute fracture or dislocation. Bones osteoporotic. Symmetric narrowing both hip joints. Multifocal areas of atherosclerosis in pelvic arterial vessels.  Lumbar spine 2 views:IMPRESSION: Scoliosis with multilevel arthropathy. No acute fracture or spondylolisthesis evident. There is extensive aortoiliac atherosclerosis.   __________________________________________  PROCEDURES  Procedure(s) performed: None  Critical Care performed: None  ____________________________________________   ED COURSE / ASSESSMENT AND PLAN  Pertinent labs & imaging results that were available during my care of the patient were reviewed by me and considered in my medical decision making (see chart for details).   Mr. Altamese CabalMerry was brought in for slipping out of his bed. No reported head injury or neck injury. He is complaining of some low back pain, and his x-rays are reassuring. No new neurologic symptoms.  Ultimately source of fall seems to be generalized weakness and deconditioning. Blood work and urinalysis are reassuring for no acute medical condition causing his  deconditioning.  Daughter states that there has been a decline, and they've been talking about moving him to a nursing home, but may still be a few weeks off. No evidence of acute stroke based on history or exam today. No evidence or suspicion for head or neck injury.  We did attempt to stand him up and he was really unable to bear his own weight even with trying to use a walker. I have concern about him being able to be at home alone right now. I consulted social work to speak with the family about addressing nursing home placement. Physical therapy consult was placed.  Physical therapist was really unable to get the patient to stand at all really on his own.  Based on witnessing this, daughter who was going to try to take care of him at home over the weekend, feels she will be unable to do this and I think it is quite possible that she would not feel that he is on her own.  She is prepared for him to go any facility he was accepted to come rather than all fields where they had originally intended for him to go.  Claudine was social work is working on the placement options.  [facility]. Patient will be transferred by EMS once but is assigned.  CONSULTATIONS:   Social work   Patient / Family / Caregiver informed of clinical course, medical decision-making process, and agree with plan.     ___________________________________________   FINAL CLINICAL IMPRESSION(S) / ED DIAGNOSES   Final diagnoses:  Acute midline low back pain without sciatica  Generalized weakness  Gait instability              Note: This dictation was prepared with Dragon dictation. Any transcriptional errors that result from this process are unintentional    Governor Rooksebecca Mikylah Ackroyd, MD 11/22/15 1418

## 2015-11-22 NOTE — ED Notes (Signed)
Patient transported to X-ray 

## 2015-12-01 ENCOUNTER — Ambulatory Visit (INDEPENDENT_AMBULATORY_CARE_PROVIDER_SITE_OTHER): Payer: Self-pay | Admitting: Vascular Surgery

## 2015-12-03 ENCOUNTER — Non-Acute Institutional Stay (SKILLED_NURSING_FACILITY): Payer: Medicare Other | Admitting: Gerontology

## 2015-12-03 DIAGNOSIS — I739 Peripheral vascular disease, unspecified: Secondary | ICD-10-CM | POA: Diagnosis not present

## 2015-12-03 NOTE — Progress Notes (Signed)
Location:      Place of Service:  SNF (31) Provider:  Toni Arthurs, NP-C  THIES, DAVID, MD  Patient Care Team: Ezequiel Kayser, MD as PCP - General (Internal Medicine)  Extended Emergency Contact Information Primary Emergency Contact: Byron of [place] Phone: [phone] Relation: Daughter  Code Status:  dnr Goals of care: Advanced Directive information Advanced Directives 11/22/2015  Does patient have an advance directive? Yes  Type of Paramedic of [place];Living will  Does patient want to make changes to advanced directive? -  Copy of advanced directive(s) in chart? No - copy requested     Chief Complaint  Patient presents with  . Acute Visit    HPI:  Pt is a 82 y.o. male seen today for an acute visit for evaluation of skin condition related to peripheral vascular disease. Skin on BLE dry with multiple wounds scattered throughout. Wounds are covered with scabs. No redness, no pain, no drainage. No weeping of the skin. No open ulcerations. Pt was using an UNNA boot prior to admission to the facility under the direction of [facility]. However, pt has cancelled the last few appointments he had with them. The skin on the feet are dry with deep, wrinkled appearance. The skin on the right foot is red and warm, but non tender. B-pedal pulses intact. Pt c/o intermittent "stabbing" pain in B-legs (not simultaneously). Pain does not last long. Otherwise, no other complaints. VSS    Past Medical History:  Diagnosis Date  . A-fib ([place])   . Acid reflux   . Arthritis   . Dementia   . Diabetes ([place])   . Hyperlipidemia   . Hypertension    Past Surgical History:  Procedure Laterality Date  . CARDIAC SURGERY    . OTHER SURGICAL HISTORY     could not be obtained    No Known Allergies    Medication List       Accurate as of 12/03/15  4:54 PM. Always use your most recent med list.          aspirin EC 81  MG tablet Take 81 mg by mouth daily.   azelastine 0.1 % nasal spray Commonly known as:  ASTELIN Place 1 spray into both nostrils 2 (two) times daily. Reported on 06/06/2015   cilostazol 50 MG tablet Commonly known as:  PLETAL Take 50 mg by mouth 2 (two) times daily.   fluticasone 50 MCG/ACT nasal spray Commonly known as:  FLONASE Place 1 spray into both nostrils daily.   glimepiride 4 MG tablet Commonly known as:  AMARYL   metoprolol tartrate 25 MG tablet Commonly known as:  LOPRESSOR Take 25 mg by mouth 2 (two) times daily.       Review of Systems  Constitutional: Negative for activity change, appetite change, chills, diaphoresis and fever.  HENT: Negative.   Respiratory: Negative for apnea, cough, choking, chest tightness, shortness of breath and wheezing.   Cardiovascular: Negative for chest pain, palpitations and leg swelling.  Gastrointestinal: Negative.   Genitourinary: Negative.   Musculoskeletal: Positive for myalgias. Negative for back pain and gait problem. Arthralgias: typical arthritis.  Skin: Negative for color change, pallor, rash and wound.  Neurological: Negative for dizziness, tremors, syncope, speech difficulty, weakness, numbness and headaches.  Psychiatric/Behavioral: Negative for agitation and behavioral problems.  All other systems reviewed and are negative.    There is no immunization history on file for this patient. Pertinent  Health Maintenance Due  Topic Date Due  . HEMOGLOBIN A1C  Sep 01, 1917  . FOOT EXAM  04/08/1929  . OPHTHALMOLOGY EXAM  04/08/1929  . URINE MICROALBUMIN  04/08/1929  . PNA vac Low Risk Adult (1 of 2 - PCV13) 04/08/1984  . INFLUENZA VACCINE  09/16/2015   No flowsheet data found. Functional Status Survey:    Vitals:   12/03/15 0600  BP: (!) 156/80  Pulse: 79  Resp: (!) 24  Temp: (!) 95.7 F (35.4 C)  SpO2: 99%   There is no height or weight on file to calculate BMI. Physical Exam  Constitutional: He is oriented  to person, place, and time. Vital signs are normal. He appears well-developed and well-nourished. He is active and cooperative. He does not appear ill. No distress.  HENT:  Head: Normocephalic and atraumatic.  Mouth/Throat: Uvula is midline, oropharynx is clear and moist and mucous membranes are normal. Mucous membranes are not pale, not dry and not cyanotic.  Eyes: Conjunctivae, EOM and lids are normal. Pupils are equal, round, and reactive to light.  Neck: Trachea normal, normal range of motion and full passive range of motion without pain. Neck supple. No JVD present. No tracheal deviation, no edema and no erythema present. No thyromegaly present.  Cardiovascular: Normal rate, regular rhythm, normal heart sounds and intact distal pulses.  Exam reveals no gallop, no distant heart sounds and no friction rub.   No murmur heard. Pulses:      Dorsalis pedis pulses are 1+ on the right side, and 1+ on the left side.  Pulmonary/Chest: Effort normal and breath sounds normal. No accessory muscle usage. No respiratory distress. He has no wheezes. He has no rhonchi. He has no rales. He exhibits no tenderness.  Abdominal: Normal appearance and bowel sounds are normal. He exhibits no distension and no ascites. There is no tenderness.  Musculoskeletal: Normal range of motion. He exhibits no edema or tenderness.  Expected osteoarthritis, stiffness  Neurological: He is alert and oriented to person, place, and time. He has normal strength.  Skin: Skin is warm, dry and intact. He is not diaphoretic. No cyanosis. No pallor. Nails show no clubbing.  Mild erythema around the scabs- multiple areas. Right foot more red, warm, with 1+ edema.   Psychiatric: He has a normal mood and affect. His speech is normal and behavior is normal. Judgment and thought content normal. Cognition and memory are normal.  Nursing note and vitals reviewed.   Labs reviewed:  Recent Labs  02/19/15 2144 02/20/15 0439 11/22/15 0739    NA 128* 133* 133*  K 4.5 4.3 4.5  CL 95* 102 100*  CO2 22 25 27   GLUCOSE 119* 78 126*  BUN 24* 21* 20  CREATININE 0.95 0.91 0.86  CALCIUM 8.7* 8.3* 9.4    Recent Labs  02/19/15 2144  AST 36  ALT 26  ALKPHOS 61  BILITOT 1.1  PROT 7.3  ALBUMIN 4.3    Recent Labs  02/19/15 2144 02/20/15 0439 11/22/15 0739  WBC 8.8 7.9 8.4  NEUTROABS  --  6.3 6.6*  HGB 13.8 13.1 14.0  HCT 40.7 37.2* 41.4  MCV 96.6 96.7 98.8  PLT 142* 123* 211   Lab Results  Component Value Date   TSH 0.970 04/14/2011   No results found for: HGBA1C No results found for: CHOL, HDL, LDLCALC, LDLDIRECT, TRIG, CHOLHDL  Significant Diagnostic Results in last 30 days:  Dg Lumbar Spine 2-3 Views  Result Date: 11/22/2015 CLINICAL DATA:  Pain following fall EXAM: LUMBAR SPINE -  2-3 VIEW COMPARISON:  None. FINDINGS: Frontal, lateral, and spot lumbosacral lateral images were obtained. There are 5 non-rib-bearing lumbar type vertebral bodies. There is lumbar levoscoliosis. There is no evidence of acute fracture or spondylolisthesis. There is moderately severe disc space narrowing at L3-4 and L4-5. There is mild disc space narrowing at other levels. There are anterior osteophytes at all levels. Bones are osteoporotic. There is extensive calcification in the aorta and iliac arteries. IMPRESSION: Scoliosis with multilevel arthropathy. No acute fracture or spondylolisthesis evident. There is extensive aortoiliac atherosclerosis. Electronically Signed   By: Lowella Grip III M.D.   On: 11/22/2015 08:22   Dg Pelvis 1-2 Views  Result Date: 11/22/2015 CLINICAL DATA:  Pain following fall EXAM: PELVIS - 1-2 VIEW COMPARISON:  None. FINDINGS: There is no evidence of pelvic fracture or dislocation. There is fairly mild symmetric narrowing of both hip joints. No erosive change. There is lower lumbar levoscoliosis. Bones are osteoporotic. There are multiple foci of arterial vascular calcification throughout the pelvis.  IMPRESSION: No acute fracture or dislocation. Bones osteoporotic. Symmetric narrowing both hip joints. Multifocal areas of atherosclerosis in pelvic arterial vessels. Electronically Signed   By: Lowella Grip III M.D.   On: 11/22/2015 08:23   Ct Head Wo Contrast  Result Date: 11/22/2015 CLINICAL DATA:  Pt arrives via EMS from [facility], pt had a fall last night and was put back in bed, caregiver states this AM pt has been complaining of bilateral leg pain, hip pain and back pain, EMS states pt was unable to stand for them, pt uses walker at baseline. EXAM: CT HEAD WITHOUT CONTRAST TECHNIQUE: Contiguous axial images were obtained from the base of the skull through the vertex without intravenous contrast. COMPARISON:  02/19/2015 FINDINGS: Brain: The ventricles are normal in configuration. There is ventricular and sulcal enlargement reflecting moderate atrophy. No hydrocephalus. There are no parenchymal masses or mass effect. 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 Patchy areas of white matter hypoattenuation are noted consistent with mild chronic microvascular ischemic change. There is no evidence of a recent infarct. There are no extra-axial masses or abnormal fluid collections. There is no intracranial hemorrhage. Vascular: No hyperdense vessel or unexpected calcification. Skull: Normal. Negative for fracture or focal lesion. Sinuses/Orbits: Visualize globes and orbits are unremarkable. Visualized sinuses show mild anterior ethmoid air cell mucosal thickening and inferior frontal sinus mucosal thickening. Clear mastoid air cells. Other: Stable appearance from the prior study. IMPRESSION: 1. No acute intracranial abnormalities. 2. Atrophy, old left parietal infarct and mild chronic microvascular ischemic change. Electronically Signed   By: Lajean Manes M.D.   On: 11/22/2015 11:31    Assessment/Plan 1. Peripheral vascular disease (HCC)  Wash legs BID  with antibacterial soap, rinse well, dry well  Apply liberal amount of Eucerin cream to legs BID after cleaning skin  Family/ staff Communication:   Total Time:  Documentation:  Face to Face:  Family/Phone:   Labs/tests ordered:  Cbc, met c  Medication list reviewed and assessed for continued appropriateness.  Vikki Ports, NP-C Geriatrics [facility] [phone], [place] [address] Cell Phone (Mon-Fri 8am-5pm):  [phone] On Call:  [phone] & follow prompts after 5pm & weekends Office Phone:  [phone] Office Fax:  [phone]

## 2015-12-04 DIAGNOSIS — E871 Hypo-osmolality and hyponatremia: Secondary | ICD-10-CM | POA: Diagnosis not present

## 2015-12-04 DIAGNOSIS — I129 Hypertensive chronic kidney disease with stage 1 through stage 4 chronic kidney disease, or unspecified chronic kidney disease: Secondary | ICD-10-CM | POA: Diagnosis not present

## 2015-12-04 LAB — CBC WITH DIFFERENTIAL/PLATELET
BASOS ABS: 0 10*3/uL (ref 0–0.1)
BASOS PCT: 1 %
Eosinophils Absolute: 0.1 10*3/uL (ref 0–0.7)
Eosinophils Relative: 2 %
HEMATOCRIT: 34.9 % — AB (ref 40.0–52.0)
HEMOGLOBIN: 12.2 g/dL — AB (ref 13.0–18.0)
Lymphocytes Relative: 8 %
Lymphs Abs: 0.7 10*3/uL — ABNORMAL LOW (ref 1.0–3.6)
MCH: 33.6 pg (ref 26.0–34.0)
MCHC: 34.9 g/dL (ref 32.0–36.0)
MCV: 96.2 fL (ref 80.0–100.0)
Monocytes Absolute: 0.8 10*3/uL (ref 0.2–1.0)
Monocytes Relative: 9 %
NEUTROS ABS: 7.1 10*3/uL — AB (ref 1.4–6.5)
NEUTROS PCT: 80 %
Platelets: 258 10*3/uL (ref 150–440)
RBC: 3.63 MIL/uL — AB (ref 4.40–5.90)
RDW: 12.8 % (ref 11.5–14.5)
WBC: 8.8 10*3/uL (ref 3.8–10.6)

## 2015-12-04 LAB — COMPREHENSIVE METABOLIC PANEL
ALBUMIN: 3 g/dL — AB (ref 3.5–5.0)
ALK PHOS: 65 U/L (ref 38–126)
ALT: 19 U/L (ref 17–63)
AST: 20 U/L (ref 15–41)
Anion gap: 5 (ref 5–15)
BILIRUBIN TOTAL: 0.8 mg/dL (ref 0.3–1.2)
BUN: 26 mg/dL — AB (ref 6–20)
CALCIUM: 8.9 mg/dL (ref 8.9–10.3)
CO2: 27 mmol/L (ref 22–32)
Chloride: 93 mmol/L — ABNORMAL LOW (ref 101–111)
Creatinine, Ser: 0.72 mg/dL (ref 0.61–1.24)
GFR calc Af Amer: >60 mL/min (ref >60)
GFR calc non Af Amer: >60 mL/min (ref >60)
GLUCOSE: 138 mg/dL — AB (ref 65–99)
Potassium: 4.6 mmol/L (ref 3.5–5.1)
Sodium: 125 mmol/L — ABNORMAL LOW (ref 135–145)
TOTAL PROTEIN: 6.4 g/dL — AB (ref 6.5–8.1)

## 2015-12-07 DIAGNOSIS — I129 Hypertensive chronic kidney disease with stage 1 through stage 4 chronic kidney disease, or unspecified chronic kidney disease: Secondary | ICD-10-CM | POA: Diagnosis not present

## 2015-12-07 LAB — CBC WITH DIFFERENTIAL/PLATELET
Basophils Absolute: 0 10*3/uL (ref 0–0.1)
Basophils Relative: 1 %
EOS PCT: 1 %
Eosinophils Absolute: 0.1 10*3/uL (ref 0–0.7)
HCT: 34.4 % — ABNORMAL LOW (ref 40.0–52.0)
HEMOGLOBIN: 11.9 g/dL — AB (ref 13.0–18.0)
LYMPHS ABS: 0.7 10*3/uL — AB (ref 1.0–3.6)
LYMPHS PCT: 8 %
MCH: 33.5 pg (ref 26.0–34.0)
MCHC: 34.6 g/dL (ref 32.0–36.0)
MCV: 96.9 fL (ref 80.0–100.0)
Monocytes Absolute: 1 10*3/uL (ref 0.2–1.0)
Monocytes Relative: 11 %
NEUTROS PCT: 79 %
Neutro Abs: 7 10*3/uL — ABNORMAL HIGH (ref 1.4–6.5)
PLATELETS: 218 10*3/uL (ref 150–440)
RBC: 3.55 MIL/uL — AB (ref 4.40–5.90)
RDW: 12.6 % (ref 11.5–14.5)
WBC: 8.8 10*3/uL (ref 3.8–10.6)

## 2015-12-07 LAB — COMPREHENSIVE METABOLIC PANEL
ALK PHOS: 66 U/L (ref 38–126)
ALT: 18 U/L (ref 17–63)
AST: 18 U/L (ref 15–41)
Albumin: 3 g/dL — ABNORMAL LOW (ref 3.5–5.0)
Anion gap: 6 (ref 5–15)
BUN: 19 mg/dL (ref 6–20)
CALCIUM: 8.8 mg/dL — AB (ref 8.9–10.3)
CHLORIDE: 93 mmol/L — AB (ref 101–111)
CO2: 26 mmol/L (ref 22–32)
CREATININE: 0.66 mg/dL (ref 0.61–1.24)
GFR calc non Af Amer: >60 mL/min (ref >60)
Glucose, Bld: 150 mg/dL — ABNORMAL HIGH (ref 65–99)
Potassium: 4.9 mmol/L (ref 3.5–5.1)
SODIUM: 125 mmol/L — AB (ref 135–145)
Total Bilirubin: 0.8 mg/dL (ref 0.3–1.2)
Total Protein: 6.3 g/dL — ABNORMAL LOW (ref 6.5–8.1)

## 2015-12-08 ENCOUNTER — Non-Acute Institutional Stay (SKILLED_NURSING_FACILITY): Payer: Medicare Other | Admitting: Gerontology

## 2015-12-08 DIAGNOSIS — E871 Hypo-osmolality and hyponatremia: Secondary | ICD-10-CM | POA: Diagnosis not present

## 2015-12-08 DIAGNOSIS — J189 Pneumonia, unspecified organism: Secondary | ICD-10-CM

## 2015-12-08 NOTE — Progress Notes (Signed)
Location:      Place of Service:  SNF (31) Provider:  Toni Arthurs, NP-C  THIES, DAVID, MD  Patient Care Team: Ezequiel Kayser, MD as PCP - General (Internal Medicine)  Extended Emergency Contact Information Primary Emergency Contact: [place] Phone: [phone] Relation: Daughter  Code Status:  DO NOT RESUSCITATE, DO NOT INTUBATE, do not hospitalize Goals of care: Advanced Directive information Advanced Directives 11/22/2015  Does patient have an advance directive? Yes  Type of Paramedic of [place];Living will  Does patient want to make changes to advanced directive? -  Copy of advanced directive(s) in chart? No - copy requested     Chief Complaint  Patient presents with  . Acute Visit    HPI:  Pt is a 82 y.o. male seen today for an acute visit for Dyspnea, wheezing and severe hyponatremia. Was called urgently to the room by the floor nurse. Nursing reports change in condition since this morning. Patient was having increased dyspnea, increased wheezing increased coughing, increased confusion. Patient denies chest pain. Patient also denies severe dyspnea, though it is visibly obvious he is having labored breathing and use of accessory muscles. Patient is having productive cough with thick, brown secretions that are very difficult to expectorate. O2 saturation level has remained stable. Patient is ill-appearing. Despite this, patient is still pleasant and in good humor. I called daughter and granddaughter, I spent approximately 40 minutes total on the phone with them explaining the seriousness of his condition at this point. I asked both of them if they would want him to be sent out to the hospital if his condition worsened. I explained this would likely result in him being intubated or on BiPAP. Both daughter and granddaughter said that he would not want that. They want [place] to keep him comfortable and treat him as best that  we can in facility. They understand he is and poor health and realize that he has had a sharp decline in his condition over the past several weeks. Daughter and granddaughter were open to a palliative care consult. I also explained to daughter and granddaughter the extent of his severe hyponatremia. This could potentially be causing some respiratory distress and confusion as well as muscle weakness. I explained to the granddaughter about starting the IV fluids but then having to stop them due to the increased JVD. Explained that this to emphasize point of the downhill cascade patient is experiencing. Granddaughter verbalized understanding. At this time. Vital signs are stable, no other complaints. Will monitor closely   Past Medical History:  Diagnosis Date  . A-fib ([place])   . Acid reflux   . Arthritis   . Dementia   . Diabetes ([place])   . Hyperlipidemia   . Hypertension    Past Surgical History:  Procedure Laterality Date  . CARDIAC SURGERY    . OTHER SURGICAL HISTORY     could not be obtained    No Known Allergies    Medication List       Accurate as of 12/08/15  6:14 PM. Always use your most recent med list.          aspirin EC 81 MG tablet Take 81 mg by mouth daily.   azelastine 0.1 % nasal spray Commonly known as:  ASTELIN Place 1 spray into both nostrils 2 (two) times daily. Reported on 06/06/2015   cilostazol 50 MG tablet Commonly known as:  PLETAL Take 50 mg by mouth 2 (two)  times daily.   fluticasone 50 MCG/ACT nasal spray Commonly known as:  FLONASE Place 1 spray into both nostrils daily.   glimepiride 4 MG tablet Commonly known as:  AMARYL   metoprolol tartrate 25 MG tablet Commonly known as:  LOPRESSOR Take 25 mg by mouth 2 (two) times daily.       Review of Systems  Unable to perform ROS: Acuity of condition  Constitutional: Positive for activity change, appetite change, fatigue and fever. Negative for chills, diaphoresis and unexpected weight  change.  HENT: Positive for congestion, trouble swallowing and voice change. Negative for drooling, facial swelling, postnasal drip, sinus pressure, sneezing and sore throat.   Eyes: Negative for pain, redness and visual disturbance.  Respiratory: Positive for cough, shortness of breath and wheezing. Negative for apnea, choking and chest tightness.   Cardiovascular: Negative for chest pain, palpitations and leg swelling.  Gastrointestinal: Negative for abdominal distention, abdominal pain, constipation, diarrhea and nausea.  Genitourinary: Negative for difficulty urinating, dysuria, frequency and urgency.  Musculoskeletal: Negative for back pain, gait problem and myalgias. Arthralgias: typical arthritis.  Skin: Positive for color change (severe PVD, feet cold and mottled at baseline). Negative for pallor, rash and wound.  Neurological: Positive for speech difficulty and weakness. Negative for dizziness, tremors, syncope, numbness and headaches.  Psychiatric/Behavioral: Positive for confusion and decreased concentration. Negative for agitation and behavioral problems.  All other systems reviewed and are negative.    There is no immunization history on file for this patient. Pertinent  Health Maintenance Due  Topic Date Due  . HEMOGLOBIN A1C  06/12/19  . FOOT EXAM  04/08/1927  . OPHTHALMOLOGY EXAM  04/08/1927  . URINE MICROALBUMIN  [birth year]29  . PNA vac Low Risk Adult (1 of 2 - PCV13) 04/08/1984  . INFLUENZA VACCINE  09/16/2015   No flowsheet data found. Functional Status Survey:    Vitals:   12/08/15 0500  BP: (!) 158/68  Pulse: 70  Resp: 20  Temp: (!) 96.5 F (35.8 C)  SpO2: 98%   There is no height or weight on file to calculate BMI. Physical Exam  Constitutional: He is oriented to person, place, and time. Vital signs are normal. He appears well-developed and well-nourished. He is active and cooperative. He appears ill. No distress.  HENT:  Head: Normocephalic and  atraumatic.  Right Ear: Decreased hearing is noted.  Left Ear: Decreased hearing is noted.  Mouth/Throat: Uvula is midline, oropharynx is clear and moist and mucous membranes are normal. Mucous membranes are not pale, not dry and not cyanotic.  Eyes: Conjunctivae, EOM and lids are normal. Pupils are equal, round, and reactive to light.  Neck: Trachea normal, normal range of motion and full passive range of motion without pain. Neck supple. JVD present. Carotid bruit is not present. No tracheal deviation, no edema, no erythema and normal range of motion present. No thyromegaly present.  Cardiovascular: Normal rate, normal heart sounds and intact distal pulses.  An irregular rhythm present. Exam reveals no gallop, no distant heart sounds and no friction rub.   No murmur heard. Pulses:      Dorsalis pedis pulses are 1+ on the right side, and 1+ on the left side.  Pulmonary/Chest: Accessory muscle usage (Pleural Rub LUL) present. No stridor. Tachypnea noted. He is in respiratory distress. He has wheezes (faint) in the right upper field, the right middle field, the right lower field, the left upper field, the left middle field and the left lower field. He has rhonchi in  the right upper field and the left upper field. He exhibits no tenderness.  Abdominal: Soft. Normal appearance and bowel sounds are normal. He exhibits no distension and no ascites. There is no tenderness.  Musculoskeletal: Normal range of motion. He exhibits no edema or tenderness.  Expected osteoarthritis, stiffness; gen weakness  Neurological: He is alert and oriented to person, place, and time. He has normal strength.  Skin: Skin is warm, dry and intact. He is not diaphoretic. There is cyanosis (B- feet, mottling severe PVD). There is pallor. Nails show no clubbing.  Psychiatric: He has a normal mood and affect. His speech is normal and behavior is normal. Judgment and thought content normal. Cognition and memory are normal.  Nursing  note and vitals reviewed.   Labs reviewed:  Recent Labs  11/22/15 0739 12/04/15 0635 12/07/15 0515  NA 133* 125* 125*  K 4.5 4.6 4.9  CL 100* 93* 93*  CO2 27 27 26   GLUCOSE 126* 138* 150*  BUN 20 26* 19  CREATININE 0.86 0.72 0.66  CALCIUM 9.4 8.9 8.8*    Recent Labs  02/19/15 2144 12/04/15 0635 12/07/15 0515  AST 36 20 18  ALT 26 19 18   ALKPHOS 61 65 66  BILITOT 1.1 0.8 0.8  PROT 7.3 6.4* 6.3*  ALBUMIN 4.3 3.0* 3.0*    Recent Labs  11/22/15 0739 12/04/15 0635 12/07/15 0515  WBC 8.4 8.8 8.8  NEUTROABS 6.6* 7.1* 7.0*  HGB 14.0 12.2* 11.9*  HCT 41.4 34.9* 34.4*  MCV 98.8 96.2 96.9  PLT 211 258 218   Lab Results  Component Value Date   TSH 0.970 04/14/2011   No results found for: HGBA1C No results found for: CHOL, HDL, LDLCALC, LDLDIRECT, TRIG, CHOLHDL  Significant Diagnostic Results in last 30 days:  Dg Lumbar Spine 2-3 Views  Result Date: 11/22/2015 CLINICAL DATA:  Pain following fall EXAM: LUMBAR SPINE - 2-3 VIEW COMPARISON:  None. FINDINGS: Frontal, lateral, and spot lumbosacral lateral images were obtained. There are 5 non-rib-bearing lumbar type vertebral bodies. There is lumbar levoscoliosis. There is no evidence of acute fracture or spondylolisthesis. There is moderately severe disc space narrowing at L3-4 and L4-5. There is mild disc space narrowing at other levels. There are anterior osteophytes at all levels. Bones are osteoporotic. There is extensive calcification in the aorta and iliac arteries. IMPRESSION: Scoliosis with multilevel arthropathy. No acute fracture or spondylolisthesis evident. There is extensive aortoiliac atherosclerosis. Electronically Signed   By: Lowella Grip III M.D.   On: 11/22/2015 08:22   Dg Pelvis 1-2 Views  Result Date: 11/22/2015 CLINICAL DATA:  Pain following fall EXAM: PELVIS - 1-2 VIEW COMPARISON:  None. FINDINGS: There is no evidence of pelvic fracture or dislocation. There is fairly mild symmetric narrowing of  both hip joints. No erosive change. There is lower lumbar levoscoliosis. Bones are osteoporotic. There are multiple foci of arterial vascular calcification throughout the pelvis. IMPRESSION: No acute fracture or dislocation. Bones osteoporotic. Symmetric narrowing both hip joints. Multifocal areas of atherosclerosis in pelvic arterial vessels. Electronically Signed   By: Lowella Grip III M.D.   On: 11/22/2015 08:23   Ct Head Wo Contrast  Result Date: 11/22/2015 CLINICAL DATA:  Pt arrives via EMS from [facility], pt had a fall last night and was put back in bed, caregiver states this AM pt has been complaining of bilateral leg pain, hip pain and back pain, EMS states pt was unable to stand for them, pt uses walker at baseline. EXAM: CT  HEAD WITHOUT CONTRAST TECHNIQUE: Contiguous axial images were obtained from the base of the skull through the vertex without intravenous contrast. COMPARISON:  02/19/2015 FINDINGS: Brain: The ventricles are normal in configuration. There is ventricular and sulcal enlargement reflecting moderate atrophy. No hydrocephalus. There are no parenchymal masses or mass effect. 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 Patchy areas of white matter hypoattenuation are noted consistent with mild chronic microvascular ischemic change. There is no evidence of a recent infarct. There are no extra-axial masses or abnormal fluid collections. There is no intracranial hemorrhage. Vascular: No hyperdense vessel or unexpected calcification. Skull: Normal. Negative for fracture or focal lesion. Sinuses/Orbits: Visualize globes and orbits are unremarkable. Visualized sinuses show mild anterior ethmoid air cell mucosal thickening and inferior frontal sinus mucosal thickening. Clear mastoid air cells. Other: Stable appearance from the prior study. IMPRESSION: 1. No acute intracranial abnormalities. 2. Atrophy, old left parietal infarct and mild chronic  microvascular ischemic change. Electronically Signed   By: Lajean Manes M.D.   On: 11/22/2015 11:31    Assessment/Plan 1. Healthcare-associated pneumonia  Levaquin 750 mg/150 mL- IV daily 7 days  Zosyn 3.75 mg IV every 6 hours 7 days for healthcare associated pneumonia  Lasix 40 mg IM 1 now for pleural infiltrates  DuoNeb nebs scheduled 3 times daily  DuoNeb nebs every 6 hours when necessary wheezing, dyspnea  Guaifenesin 100 mg per 5 mL give 10 mils by mouth 4 times a day for expectoration. Give with 8 ounces of Gatorade or soda (minimize water due to hyponatremia)  Palliative care consult  Do not hospitalize, DO NOT RESUSCITATE, DO NOT INTUBATE  2. Hyponatremia  0.9% normal saline IV infusion. 250 mL bolus 1 over 1 hour, then continuous at 50 mL an hour 72 hours- fluids were DC'd after several hours infusion due to increased JVD  Demeclocycline 300 mg by mouth twice a day  Regular diet, no sodium restriction   Gatorade twice a day  1 L free water restriction  Family/ staff Communication:   Total Time: 80 minutes  Documentation: 10 minutes  Face to Face: 30 minutes  Family/Phone: 40 minutes   Labs/tests ordered:  Two-view chest x-ray, CBC, met C in the morning  Medication list reviewed and assessed for continued appropriateness.  Vikki Ports, NP-C [facility] [phone], [place] [address] Cell Phone (Mon-Fri 8am-5pm):  [phone] On Call:  [phone] & follow prompts after 5pm & weekends Office Phone:  [phone] Office Fax:  [phone]

## 2015-12-09 DIAGNOSIS — E119 Type 2 diabetes mellitus without complications: Secondary | ICD-10-CM

## 2015-12-09 DIAGNOSIS — Z515 Encounter for palliative care: Secondary | ICD-10-CM

## 2015-12-09 DIAGNOSIS — I129 Hypertensive chronic kidney disease with stage 1 through stage 4 chronic kidney disease, or unspecified chronic kidney disease: Secondary | ICD-10-CM | POA: Diagnosis not present

## 2015-12-09 DIAGNOSIS — I4891 Unspecified atrial fibrillation: Secondary | ICD-10-CM

## 2015-12-09 DIAGNOSIS — I739 Peripheral vascular disease, unspecified: Secondary | ICD-10-CM

## 2015-12-09 DIAGNOSIS — F0150 Vascular dementia without behavioral disturbance: Secondary | ICD-10-CM

## 2015-12-09 DIAGNOSIS — Z66 Do not resuscitate: Secondary | ICD-10-CM

## 2015-12-09 DIAGNOSIS — I10 Essential (primary) hypertension: Secondary | ICD-10-CM

## 2015-12-09 LAB — CBC WITH DIFFERENTIAL/PLATELET
BASOS ABS: 0 10*3/uL (ref 0–0.1)
Basophils Relative: 0 %
EOS ABS: 0 10*3/uL (ref 0–0.7)
EOS PCT: 0 %
HCT: 31.7 % — ABNORMAL LOW (ref 40.0–52.0)
Hemoglobin: 11.3 g/dL — ABNORMAL LOW (ref 13.0–18.0)
LYMPHS PCT: 4 %
Lymphs Abs: 0.5 10*3/uL — ABNORMAL LOW (ref 1.0–3.6)
MCH: 34.1 pg — ABNORMAL HIGH (ref 26.0–34.0)
MCHC: 35.6 g/dL (ref 32.0–36.0)
MCV: 95.8 fL (ref 80.0–100.0)
Monocytes Absolute: 1.3 10*3/uL — ABNORMAL HIGH (ref 0.2–1.0)
Monocytes Relative: 11 %
Neutro Abs: 9.8 10*3/uL — ABNORMAL HIGH (ref 1.4–6.5)
Neutrophils Relative %: 85 %
PLATELETS: 212 10*3/uL (ref 150–440)
RBC: 3.3 MIL/uL — AB (ref 4.40–5.90)
RDW: 12.9 % (ref 11.5–14.5)
WBC: 11.6 10*3/uL — AB (ref 3.8–10.6)

## 2015-12-09 LAB — COMPREHENSIVE METABOLIC PANEL
ALBUMIN: 2.8 g/dL — AB (ref 3.5–5.0)
ALT: 17 U/L (ref 17–63)
AST: 21 U/L (ref 15–41)
Alkaline Phosphatase: 61 U/L (ref 38–126)
Anion gap: 9 (ref 5–15)
BUN: 19 mg/dL (ref 6–20)
CHLORIDE: 95 mmol/L — AB (ref 101–111)
CO2: 22 mmol/L (ref 22–32)
CREATININE: 0.69 mg/dL (ref 0.61–1.24)
Calcium: 8.6 mg/dL — ABNORMAL LOW (ref 8.9–10.3)
GFR calc Af Amer: >60 mL/min (ref >60)
GFR calc non Af Amer: >60 mL/min (ref >60)
GLUCOSE: 121 mg/dL — AB (ref 65–99)
Potassium: 4.3 mmol/L (ref 3.5–5.1)
SODIUM: 126 mmol/L — AB (ref 135–145)
Total Bilirubin: 0.8 mg/dL (ref 0.3–1.2)
Total Protein: 6 g/dL — ABNORMAL LOW (ref 6.5–8.1)

## 2015-12-10 ENCOUNTER — Other Ambulatory Visit: Payer: Self-pay | Admitting: Gerontology

## 2015-12-10 ENCOUNTER — Non-Acute Institutional Stay (SKILLED_NURSING_FACILITY): Payer: Medicare Other | Admitting: Gerontology

## 2015-12-10 DIAGNOSIS — J189 Pneumonia, unspecified organism: Secondary | ICD-10-CM | POA: Diagnosis not present

## 2015-12-10 DIAGNOSIS — R1312 Dysphagia, oropharyngeal phase: Secondary | ICD-10-CM

## 2015-12-14 DIAGNOSIS — I129 Hypertensive chronic kidney disease with stage 1 through stage 4 chronic kidney disease, or unspecified chronic kidney disease: Secondary | ICD-10-CM | POA: Diagnosis not present

## 2015-12-14 LAB — GLUCOSE, CAPILLARY: GLUCOSE-CAPILLARY: 138 mg/dL — AB (ref 65–99)

## 2015-12-15 ENCOUNTER — Non-Acute Institutional Stay (SKILLED_NURSING_FACILITY): Payer: Medicare Other | Admitting: Gerontology

## 2015-12-15 DIAGNOSIS — Z515 Encounter for palliative care: Secondary | ICD-10-CM

## 2015-12-15 DIAGNOSIS — J189 Pneumonia, unspecified organism: Secondary | ICD-10-CM

## 2015-12-17 ENCOUNTER — Encounter: Admission: RE | Admit: 2015-12-17 | Payer: Medicare Other | Source: Ambulatory Visit | Admitting: Internal Medicine

## 2015-12-17 NOTE — Progress Notes (Signed)
Location:      Place of Service:  SNF (31) Provider:  Lorenso Quarry, NP-C  THIES, DAVID, MD  Patient Care Team: Mickey Farber, MD as PCP - General (Internal Medicine)  Extended Emergency Contact Information Primary Emergency Contact: Felipa Eth [place] Home Phone: [phone] Relation: Daughter  Code Status:  DO NOT RESUSCITATE, DO NOT INTUBATE, do not hospitalize Goals of care: Advanced Directive information Advanced Directives 11/22/2015  Does patient have an advance directive? Yes  Type of Estate agent of [place];Living will  Does patient want to make changes to advanced directive? -  Copy of advanced directive(s) in chart? No - copy requested     Chief Complaint  Patient presents with  . Follow-up    HPI:  Pt is a 82 y.o. male seen today for a follow up visit for Dyspnea, wheezing and severe hyponatremia. Patient was having increased dyspnea, increased wheezing increased coughing, increased confusion, increased pain and restlessness over the weekend. Staff report difficulty managing symptoms O2 saturation level has remained stable. Patient is ill-appearing. Pt is asleep at the moment. .At this time. Vital signs are stable, no other complaints. Daughter at bedside. Held discussion regarding pain control. Daughter shared the morphine did not seem to do as well for the pt as the oxycodone did. She reports pt is now able to rest and is more comfortable. Daughter agreeable to medication change from morphine to oxycodone.   Past Medical History:  Diagnosis Date  . A-fib (HCC)   . Acid reflux   . Arthritis   . Dementia   . Diabetes (HCC)   . Hyperlipidemia   . Hypertension    Past Surgical History:  Procedure Laterality Date  . CARDIAC SURGERY    . OTHER SURGICAL HISTORY     could not be obtained    No Known Allergies    Medication List       Accurate as of 12/15/15 11:59 PM. Always use your most recent med list.            aspirin EC 81 MG tablet Take 81 mg by mouth daily.   azelastine 0.1 % nasal spray Commonly known as:  ASTELIN Place 1 spray into both nostrils 2 (two) times daily. Reported on 06/06/2015   cilostazol 50 MG tablet Commonly known as:  PLETAL Take 50 mg by mouth 2 (two) times daily.   fluticasone 50 MCG/ACT nasal spray Commonly known as:  FLONASE Place 1 spray into both nostrils daily.   glimepiride 4 MG tablet Commonly known as:  AMARYL   metoprolol tartrate 25 MG tablet Commonly known as:  LOPRESSOR Take 25 mg by mouth 2 (two) times daily.       Review of Systems  Unable to perform ROS: Acuity of condition  Constitutional: Positive for activity change, appetite change, fatigue and fever. Negative for chills, diaphoresis and unexpected weight change.  HENT: Positive for congestion, trouble swallowing and voice change. Negative for drooling, facial swelling, postnasal drip, sinus pressure, sneezing and sore throat.   Eyes: Negative for pain, redness and visual disturbance.  Respiratory: Negative for apnea, cough, choking, chest tightness, shortness of breath and wheezing.   Cardiovascular: Negative for chest pain, palpitations and leg swelling.  Gastrointestinal: Negative for abdominal distention, abdominal pain, constipation, diarrhea and nausea.  Genitourinary: Negative for difficulty urinating, dysuria, frequency and urgency.  Musculoskeletal: Negative for back pain, gait problem and myalgias. Arthralgias: typical arthritis.  Skin: Positive for color change (severe PVD, feet  cold and mottled at baseline) and pallor. Negative for rash and wound.  Neurological: Positive for speech difficulty and weakness. Negative for dizziness, tremors, syncope, numbness and headaches.  Psychiatric/Behavioral: Positive for confusion and decreased concentration. Negative for agitation and behavioral problems.  All other systems reviewed and are negative.    There is no immunization  history on file for this patient. Pertinent  Health Maintenance Due  Topic Date Due  . HEMOGLOBIN A1C  02-23-19  . FOOT EXAM  04/08/1927  . OPHTHALMOLOGY EXAM  [birth year]/08[birth year]1929  . URINE MICROALBUMIN  [birth year]/08/1929  . PNA vac Low Risk Adult (1 of 2 - PCV13) 04/08/1984  . INFLUENZA VACCINE  09/16/2015   No flowsheet data found. Functional Status Survey:    Vitals:   12/15/15 1630  BP: (!) 146/79  Pulse: 63  Resp: (!) 22  Temp: 98.9 F (37.2 C)  SpO2: 97%   There is no height or weight on file to calculate BMI. Physical Exam  Constitutional: He is oriented to person, place, and time. Vital signs are normal. He appears well-developed and well-nourished. He is active and cooperative. He appears ill. No distress.  HENT:  Head: Normocephalic and atraumatic.  Right Ear: Decreased hearing is noted.  Left Ear: Decreased hearing is noted.  Mouth/Throat: Uvula is midline, oropharynx is clear and moist and mucous membranes are normal. Mucous membranes are not pale, not dry and not cyanotic.  Eyes: Conjunctivae, EOM and lids are normal. Pupils are equal, round, and reactive to light.  Neck: Trachea normal, normal range of motion and full passive range of motion without pain. Neck supple. No JVD present. Carotid bruit is not present. No tracheal deviation, no edema, no erythema and normal range of motion present. No thyromegaly present.  Cardiovascular: Normal heart sounds and intact distal pulses.  An irregular rhythm present. Tachycardia present.  Exam reveals no gallop, no distant heart sounds and no friction rub.   No murmur heard. Pulses:      Dorsalis pedis pulses are 1+ on the right side, and 1+ on the left side.  Pulmonary/Chest: No accessory muscle usage or stridor. No tachypnea. No respiratory distress. He has decreased breath sounds in the right lower field and the left lower field. He has no wheezes. He has no rhonchi. He has no rales. He exhibits no tenderness.  Abdominal: Soft. Normal  appearance. He exhibits no distension and no ascites. Bowel sounds are decreased. There is no tenderness.  Musculoskeletal: Normal range of motion. He exhibits no edema or tenderness.  Expected osteoarthritis, stiffness; gen weakness  Neurological: He is alert and oriented to person, place, and time. He has normal strength.  Skin: Skin is warm, dry and intact. He is not diaphoretic. There is cyanosis (B- feet, mottling severe PVD). There is pallor. Nails show no clubbing.  Psychiatric: He has a normal mood and affect. His speech is normal and behavior is normal. Judgment and thought content normal. Cognition and memory are normal.  Nursing note and vitals reviewed.   Labs reviewed:  Recent Labs  12/04/15 0635 12/07/15 0515 12/09/15 0720  NA 125* 125* 126*  K 4.6 4.9 4.3  CL 93* 93* 95*  CO2 27 26 22   GLUCOSE 138* 150* 121*  BUN 26* 19 19  CREATININE 0.72 0.66 0.69  CALCIUM 8.9 8.8* 8.6*    Recent Labs  12/04/15 0635 12/07/15 0515 12/09/15 0720  AST 20 18 21   ALT 19 18 17   ALKPHOS 65 66 61  BILITOT 0.8 0.8  0.8  PROT 6.4* 6.3* 6.0*  ALBUMIN 3.0* 3.0* 2.8*    Recent Labs  12/04/15 0635 12/07/15 0515 12/09/15 0720  WBC 8.8 8.8 11.6*  NEUTROABS 7.1* 7.0* 9.8*  HGB 12.2* 11.9* 11.3*  HCT 34.9* 34.4* 31.7*  MCV 96.2 96.9 95.8  PLT 258 218 212   Lab Results  Component Value Date   TSH 0.970 04/14/2011   No results found for: HGBA1C No results found for: CHOL, HDL, LDLCALC, LDLDIRECT, TRIG, CHOLHDL  Significant Diagnostic Results in last 30 days:  Dg Lumbar Spine 2-3 Views  Result Date: 11/22/2015 CLINICAL DATA:  Pain following fall EXAM: LUMBAR SPINE - 2-3 VIEW COMPARISON:  None. FINDINGS: Frontal, lateral, and spot lumbosacral lateral images were obtained. There are 5 non-rib-bearing lumbar type vertebral bodies. There is lumbar levoscoliosis. There is no evidence of acute fracture or spondylolisthesis. There is moderately severe disc space narrowing at L3-4 and  L4-5. There is mild disc space narrowing at other levels. There are anterior osteophytes at all levels. Bones are osteoporotic. There is extensive calcification in the aorta and iliac arteries. IMPRESSION: Scoliosis with multilevel arthropathy. No acute fracture or spondylolisthesis evident. There is extensive aortoiliac atherosclerosis. Electronically Signed   By: Bretta BangWilliam  Woodruff III M.D.   On: 11/22/2015 08:22   Dg Pelvis 1-2 Views  Result Date: 11/22/2015 CLINICAL DATA:  Pain following fall EXAM: PELVIS - 1-2 VIEW COMPARISON:  None. FINDINGS: There is no evidence of pelvic fracture or dislocation. There is fairly mild symmetric narrowing of both hip joints. No erosive change. There is lower lumbar levoscoliosis. Bones are osteoporotic. There are multiple foci of arterial vascular calcification throughout the pelvis. IMPRESSION: No acute fracture or dislocation. Bones osteoporotic. Symmetric narrowing both hip joints. Multifocal areas of atherosclerosis in pelvic arterial vessels. Electronically Signed   By: Bretta BangWilliam  Woodruff III M.D.   On: 11/22/2015 08:23   Ct Head Wo Contrast  Result Date: 11/22/2015 CLINICAL DATA:  Pt arrives via EMS from [facility], pt had a fall last night and was put back in bed, caregiver states this AM pt has been complaining of bilateral leg pain, hip pain and back pain, EMS states pt was unable to stand for them, pt uses walker at baseline. EXAM: CT HEAD WITHOUT CONTRAST TECHNIQUE: Contiguous axial images were obtained from the base of the skull through the vertex without intravenous contrast. COMPARISON:  02/19/2015 FINDINGS: Brain: The ventricles are normal in configuration. There is ventricular and sulcal enlargement reflecting moderate atrophy. No hydrocephalus. There are no parenchymal masses or mass effect. 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 Patchy areas of white matter hypoattenuation are noted consistent with mild  chronic microvascular ischemic change. There is no evidence of a recent infarct. There are no extra-axial masses or abnormal fluid collections. There is no intracranial hemorrhage. Vascular: No hyperdense vessel or unexpected calcification. Skull: Normal. Negative for fracture or focal lesion. Sinuses/Orbits: Visualize globes and orbits are unremarkable. Visualized sinuses show mild anterior ethmoid air cell mucosal thickening and inferior frontal sinus mucosal thickening. Clear mastoid air cells. Other: Stable appearance from the prior study. IMPRESSION: 1. No acute intracranial abnormalities. 2. Atrophy, old left parietal infarct and mild chronic microvascular ischemic change. Electronically Signed   By: Amie Portlandavid  Ormond M.D.   On: 11/22/2015 11:31    Assessment/Plan 1. Healthcare-associated pneumonia  DuoNeb nebs every 6 hours when necessary wheezing, dyspnea  Guaifenesin 100 mg per 5 mL give 10 mils by mouth 4  times a day for expectoration. Give with 8 ounces of Gatorade or soda (minimize water due to hyponatremia)  Palliative care following pt  Do not hospitalize, DO NOT RESUSCITATE, DO NOT INTUBATE  2. Encounter for Dying Care  Roxanol not effective  Give Oxycodone 10 mg po x 1 now  Oxyfast 20 mg/ mL .025-0.5 mL po Q 1 hour prn pain, dyspnea  Oxyfast 0.5 mL po Q 4 hours scheduled  Oxygen 2-4 liters/ [place]- titrate for comfort  Continue lorazepam 0.5 mg po Q 4 hours prn  Family/ staff Communication:   Total Time:   Documentation:   Face to Face:   Family/Phone:    Labs/tests ordered:    Medication list reviewed and assessed for continued appropriateness.  Brynda RimShannon H. Neiko Trivedi, NP-C Geriatrics [facility] [phone], [place] [address] Cell Phone (Mon-Fri 8am-5pm):  [phone] On Call:  [phone] & follow prompts after 5pm & weekends Office Phone:  [phone] Office Fax:  [phone]

## 2015-12-17 NOTE — Progress Notes (Signed)
Location:      Place of Service:  SNF (31) Provider:  Lorenso QuarryShannon Xitlalic Maslin, NP-C  Vasquez, DAVID, MD  Patient Care Team: Mickey Farberavid Thies, MD as PCP - General (Internal Medicine)  Extended Emergency Contact Information Primary Emergency Contact: Felipa EthWagoner,Bonnie  [place] Home Phone: [phone] Relation: Daughter  Code Status:  DO NOT RESUSCITATE, DO NOT INTUBATE, do not hospitalize Goals of care: Advanced Directive information Advanced Directives 11/22/2015  Does patient have an advance directive? Yes  Type of Estate agentAdvance Directive Healthcare Power of [place];Living will  Does patient want to make changes to advanced directive? -  Copy of advanced directive(s) in chart? No - copy requested     Chief Complaint  Patient presents with  . Follow-up    HPI:  Pt is a 82 y.o. male seen today for a follow up visit for Dyspnea, wheezing and severe hyponatremia. Patient was having increased dyspnea, increased wheezing increased coughing, increased confusion two days ago. Patient denies chest pain. Patient also denies severe dyspnea, though it is visibly obvious he is having labored breathing and use of accessory muscles. O2 saturation level has remained stable. Patient is ill-appearing. Despite this, patient is still pleasant and in good humor.At this time. Vital signs are stable, no other complaints. Will monitor closely   Past Medical History:  Diagnosis Date  . A-fib (HCC)   . Acid reflux   . Arthritis   . Dementia   . Diabetes (HCC)   . Hyperlipidemia   . Hypertension    Past Surgical History:  Procedure Laterality Date  . CARDIAC SURGERY    . OTHER SURGICAL HISTORY     could not be obtained    No Known Allergies    Medication List       Accurate as of 12/10/15 11:59 PM. Always use your most recent med list.          aspirin EC 81 MG tablet Take 81 mg by mouth daily.   azelastine 0.1 % nasal spray Commonly known as:  ASTELIN Place 1 spray into both  nostrils 2 (two) times daily. Reported on 06/06/2015   cilostazol 50 MG tablet Commonly known as:  PLETAL Take 50 mg by mouth 2 (two) times daily.   fluticasone 50 MCG/ACT nasal spray Commonly known as:  FLONASE Place 1 spray into both nostrils daily.   glimepiride 4 MG tablet Commonly known as:  AMARYL   metoprolol tartrate 25 MG tablet Commonly known as:  LOPRESSOR Take 25 mg by mouth 2 (two) times daily.       Review of Systems  Unable to perform ROS: Acuity of condition  Constitutional: Positive for activity change, appetite change, fatigue and fever. Negative for chills, diaphoresis and unexpected weight change.  HENT: Positive for congestion, trouble swallowing and voice change. Negative for drooling, facial swelling, postnasal drip, sinus pressure, sneezing and sore throat.   Eyes: Negative for pain, redness and visual disturbance.  Respiratory: Positive for shortness of breath and wheezing. Negative for apnea, cough, choking and chest tightness.   Cardiovascular: Negative for chest pain, palpitations and leg swelling.  Gastrointestinal: Negative for abdominal distention, abdominal pain, constipation, diarrhea and nausea.  Genitourinary: Negative for difficulty urinating, dysuria, frequency and urgency.  Musculoskeletal: Negative for back pain, gait problem and myalgias. Arthralgias: typical arthritis.  Skin: Positive for color change (severe PVD, feet cold and mottled at baseline). Negative for pallor, rash and wound.  Neurological: Positive for speech difficulty and weakness. Negative for dizziness, tremors, syncope,  numbness and headaches.  Psychiatric/Behavioral: Positive for confusion and decreased concentration. Negative for agitation and behavioral problems.  All other systems reviewed and are negative.    There is no immunization history on file for this patient. Pertinent  Health Maintenance Due  Topic Date Due  . HEMOGLOBIN A1C  04/08/1919  . FOOT EXAM   04/08[birth year]1929  . OPHTHALMOLOGY EXAM  04/08/1929  . URINE MICROALBUMIN  04/08/1929  . PNA vac Low Risk Adult (1 of 2 - PCV13) 04/08/1984  . INFLUENZA VACCINE  09/16/2015   No flowsheet data found. Functional Status Survey:    Vitals:   12/10/15 0600  BP: (!) 118/48  Pulse: (!) 49  Resp: 12  Temp: 97.4 F (36.3 C)  SpO2: 96%   There is no height or weight on file to calculate BMI. Physical Exam  Constitutional: He is oriented to person, place, and time. Vital signs are normal. He appears well-developed and well-nourished. He is active and cooperative. He appears ill. No distress.  HENT:  Head: Normocephalic and atraumatic.  Right Ear: Decreased hearing is noted.  Left Ear: Decreased hearing is noted.  Mouth/Throat: Uvula is midline, oropharynx is clear and moist and mucous membranes are normal. Mucous membranes are not pale, not dry and not cyanotic.  Eyes: Conjunctivae, EOM and lids are normal. Pupils are equal, round, and reactive to light.  Neck: Trachea normal, normal range of motion and full passive range of motion without pain. Neck supple. No JVD present. Carotid bruit is not present. No tracheal deviation, no edema, no erythema and normal range of motion present. No thyromegaly present.  Cardiovascular: Normal rate, normal heart sounds and intact distal pulses.  An irregular rhythm present. Exam reveals no gallop, no distant heart sounds and no friction rub.   No murmur heard. Pulses:      Dorsalis pedis pulses are 1+ on the right side, and 1+ on the left side.  Pulmonary/Chest: Accessory muscle usage present. No stridor. Tachypnea noted. No respiratory distress. He has no wheezes. He has no rhonchi. He has no rales. He exhibits no tenderness.  Abdominal: Soft. Normal appearance and bowel sounds are normal. He exhibits no distension and no ascites. There is no tenderness.  Musculoskeletal: Normal range of motion. He exhibits no edema or tenderness.  Expected osteoarthritis,  stiffness; gen weakness  Neurological: He is alert and oriented to person, place, and time. He has normal strength.  Skin: Skin is warm, dry and intact. He is not diaphoretic. There is cyanosis (B- feet, mottling severe PVD). There is pallor. Nails show no clubbing.  Psychiatric: He has a normal mood and affect. His speech is normal and behavior is normal. Judgment and thought content normal. Cognition and memory are normal.  Nursing note and vitals reviewed.   Labs reviewed:  Recent Labs  12/04/15 0635 12/07/15 0515 12/09/15 0720  NA 125* 125* 126*  K 4.6 4.9 4.3  CL 93* 93* 95*  CO2 27 26 22   GLUCOSE 138* 150* 121*  BUN 26* 19 19  CREATININE 0.72 0.66 0.69  CALCIUM 8.9 8.8* 8.6*    Recent Labs  12/04/15 0635 12/07/15 0515 12/09/15 0720  AST 20 18 21   ALT 19 18 17   ALKPHOS 65 66 61  BILITOT 0.8 0.8 0.8  PROT 6.4* 6.3* 6.0*  ALBUMIN 3.0* 3.0* 2.8*    Recent Labs  12/04/15 0635 12/07/15 0515 12/09/15 0720  WBC 8.8 8.8 11.6*  NEUTROABS 7.1* 7.0* 9.8*  HGB 12.2* 11.9* 11.3*  HCT  34.9* 34.4* 31.7*  MCV 96.2 96.9 95.8  PLT 258 218 212   Lab Results  Component Value Date   TSH 0.970 04/14/2011   No results found for: HGBA1C No results found for: CHOL, HDL, LDLCALC, LDLDIRECT, TRIG, CHOLHDL  Significant Diagnostic Results in last 30 days:  Dg Lumbar Spine 2-3 Views  Result Date: 11/22/2015 CLINICAL DATA:  Pain following fall EXAM: LUMBAR SPINE - 2-3 VIEW COMPARISON:  None. FINDINGS: Frontal, lateral, and spot lumbosacral lateral images were obtained. There are 5 non-rib-bearing lumbar type vertebral bodies. There is lumbar levoscoliosis. There is no evidence of acute fracture or spondylolisthesis. There is moderately severe disc space narrowing at L3-4 and L4-5. There is mild disc space narrowing at other levels. There are anterior osteophytes at all levels. Bones are osteoporotic. There is extensive calcification in the aorta and iliac arteries. IMPRESSION:  Scoliosis with multilevel arthropathy. No acute fracture or spondylolisthesis evident. There is extensive aortoiliac atherosclerosis. Electronically Signed   By: Bretta Bang III M.D.   On: 11/22/2015 08:22   Dg Pelvis 1-2 Views  Result Date: 11/22/2015 CLINICAL DATA:  Pain following fall EXAM: PELVIS - 1-2 VIEW COMPARISON:  None. FINDINGS: There is no evidence of pelvic fracture or dislocation. There is fairly mild symmetric narrowing of both hip joints. No erosive change. There is lower lumbar levoscoliosis. Bones are osteoporotic. There are multiple foci of arterial vascular calcification throughout the pelvis. IMPRESSION: No acute fracture or dislocation. Bones osteoporotic. Symmetric narrowing both hip joints. Multifocal areas of atherosclerosis in pelvic arterial vessels. Electronically Signed   By: Bretta Bang III M.D.   On: 11/22/2015 08:23   Ct Head Wo Contrast  Result Date: 11/22/2015 CLINICAL DATA:  Pt arrives via EMS from [facility], pt had a fall last night and was put back in bed, caregiver states this AM pt has been complaining of bilateral leg pain, hip pain and back pain, EMS states pt was unable to stand for them, pt uses walker at baseline. EXAM: CT HEAD WITHOUT CONTRAST TECHNIQUE: Contiguous axial images were obtained from the base of the skull through the vertex without intravenous contrast. COMPARISON:  02/19/2015 FINDINGS: Brain: The ventricles are normal in configuration. There is ventricular and sulcal enlargement reflecting moderate atrophy. No hydrocephalus. There are no parenchymal masses or mass effect. 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 Patchy areas of white matter hypoattenuation are noted consistent with mild chronic microvascular ischemic change. There is no evidence of a recent infarct. There are no extra-axial masses or abnormal fluid collections. There is no intracranial hemorrhage. Vascular: No hyperdense  vessel or unexpected calcification. Skull: Normal. Negative for fracture or focal lesion. Sinuses/Orbits: Visualize globes and orbits are unremarkable. Visualized sinuses show mild anterior ethmoid air cell mucosal thickening and inferior frontal sinus mucosal thickening. Clear mastoid air cells. Other: Stable appearance from the prior study. IMPRESSION: 1. No acute intracranial abnormalities. 2. Atrophy, old left parietal infarct and mild chronic microvascular ischemic change. Electronically Signed   By: Amie Portland M.D.   On: 11/22/2015 11:31    Assessment/Plan 1. Healthcare-associated pneumonia  Continue Levaquin 750 mg/150 mL- IV daily 7 days  Continue Zosyn 3.75 mg IV every 6 hours 7 days for healthcare associated pneumonia  DuoNeb nebs every 6 hours when necessary wheezing, dyspnea  Guaifenesin 100 mg per 5 mL give 10 mils by mouth 4 times a day for expectoration. Give with 8 ounces of Gatorade or soda (minimize water  due to hyponatremia)  Palliative care consult initiated  Do not hospitalize, DO NOT RESUSCITATE, DO NOT INTUBATE  2. Hyponatremia  Demeclocycline 300 mg by mouth twice a day  Regular diet, no sodium restriction   Gatorade twice a day  1 L free water restriction  Family/ staff Communication:   Total Time:   Documentation:   Face to Face:   Family/Phone:    Labs/tests ordered:    Medication list reviewed and assessed for continued appropriateness.  Brynda Rim, NP-C Geriatrics [facility] [phone] [address], [place] [address] Cell Phone (Mon-Fri 8am-5pm):  [phone] On Call:  [phone] & follow prompts after 5pm & weekends Office Phone:  [phone] Office Fax:  [phone]

## 2015-12-17 DEATH — deceased

## 2015-12-31 ENCOUNTER — Ambulatory Visit: Admission: RE | Admit: 2015-12-31 | Payer: Medicare Other | Source: Ambulatory Visit

## 2017-05-23 IMAGING — CT CT HEAD W/O CM
3 series · 15 of 47 positions shown, 18 images · non-contrast
Comparison: 02/19/2015

CLINICAL DATA: Pt arrives via EMS from [facility], pt had a fall
last night and was put back in bed, caregiver states this AM pt has
been complaining of bilateral leg pain, hip pain and back pain, EMS
states pt was unable to stand for them, pt uses walker at baseline.

EXAM:
CT HEAD WITHOUT CONTRAST
TECHNIQUE: Contiguous axial images were obtained from the base of the skull
through the vertex without intravenous contrast.

[Series 2: head wo · axial · 0.44mm/px · z∈[-69,+56]mm · 9 of 31 slices shown, 12 images]
[im 3/31  brain]
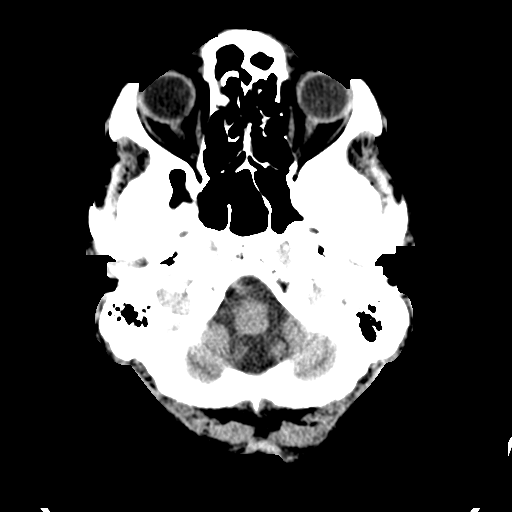
[im 3/31  bone]
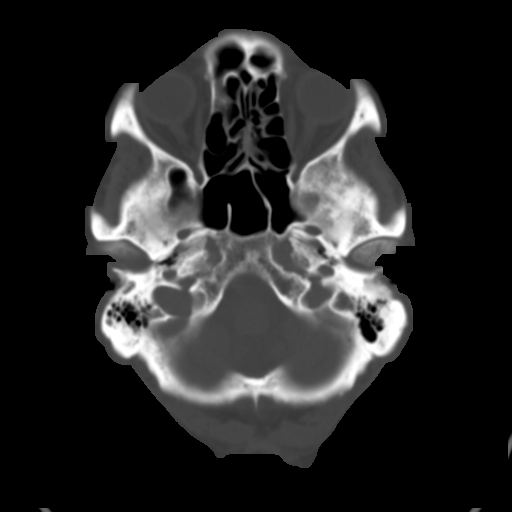
[im 6/31  brain]
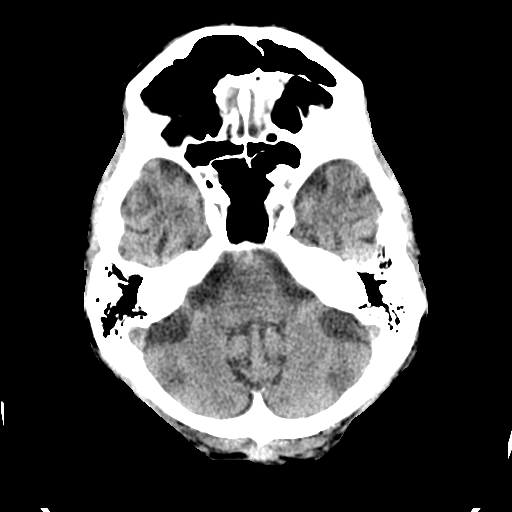
[im 9/31  brain]
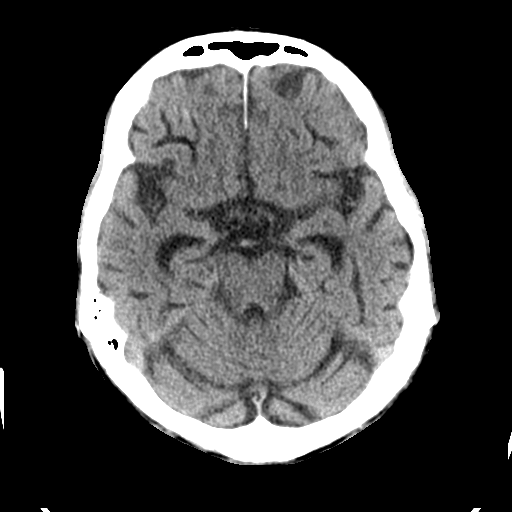
[im 12/31  brain]
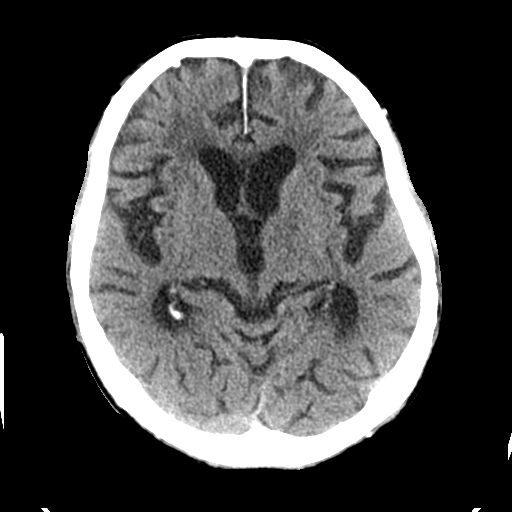
[im 16/31  brain]
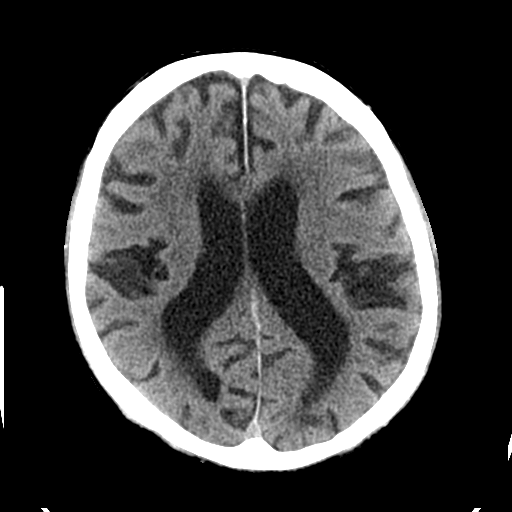
[im 16/31  bone]
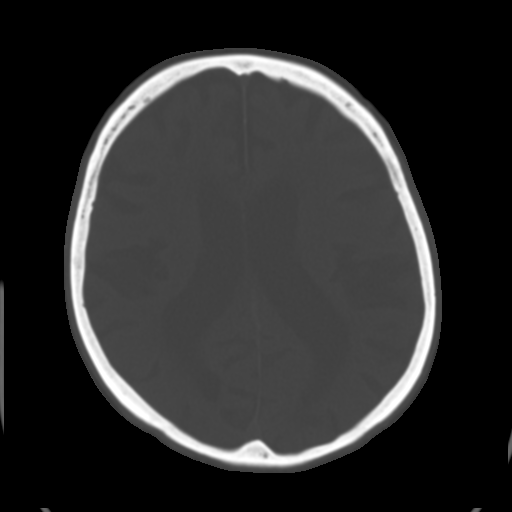
[im 19/31  brain]
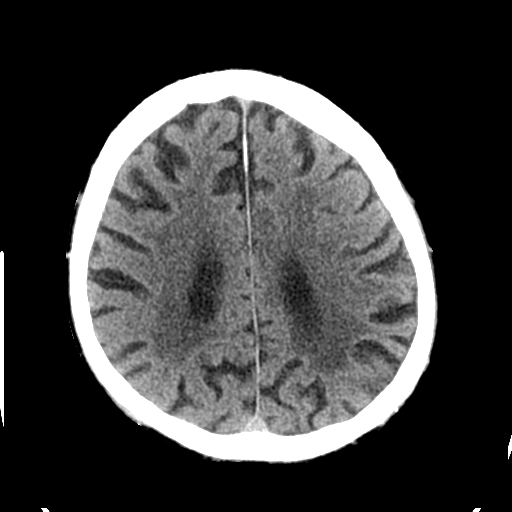
[im 22/31  brain]
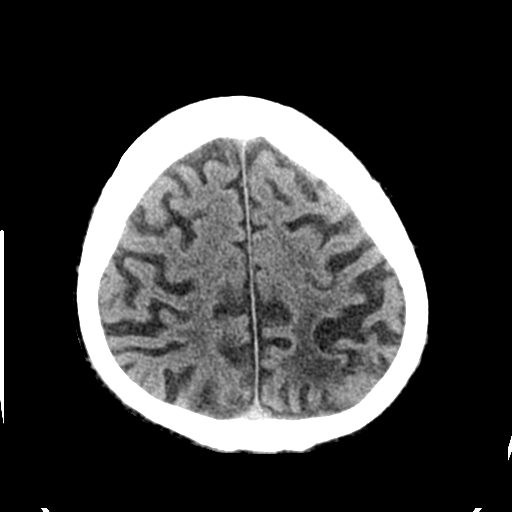
[im 25/31  brain]
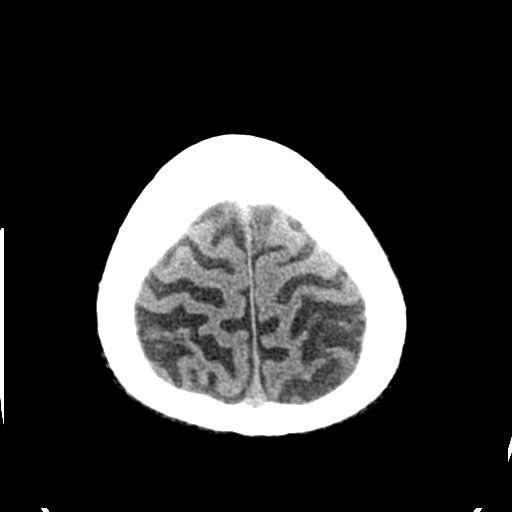
[im 28/31  brain]
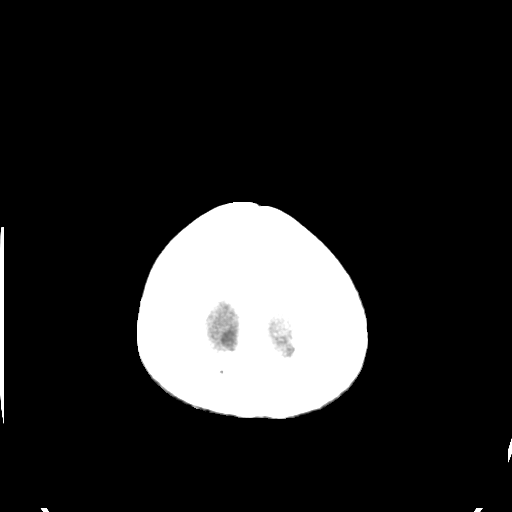
[im 28/31  bone]
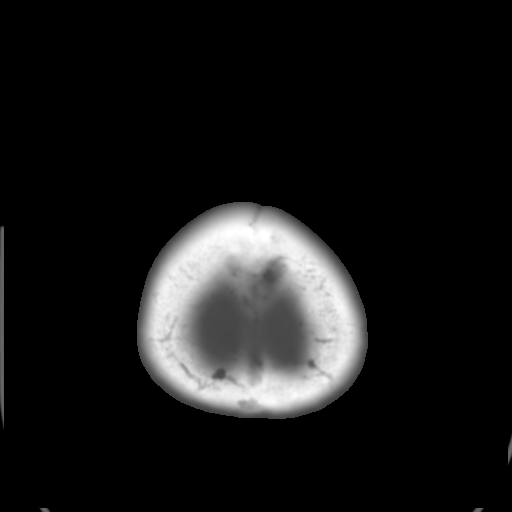

[Series 4: coronal soft tissue · coronal · 0.30mm/px · 3 of 67 slices shown]
[im 23/67  brain]
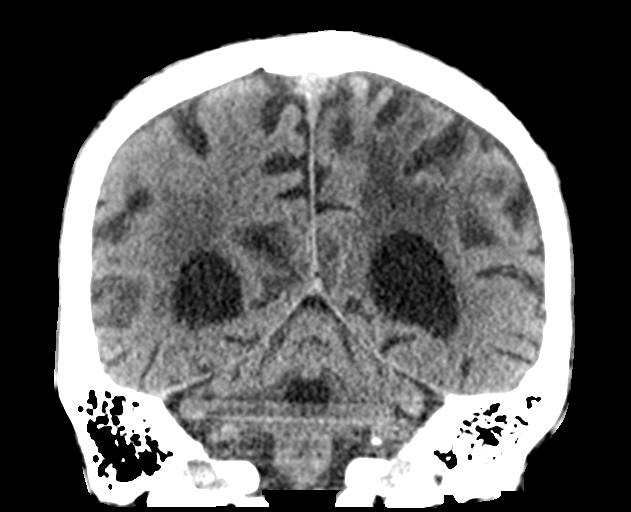
[im 30/67  brain]
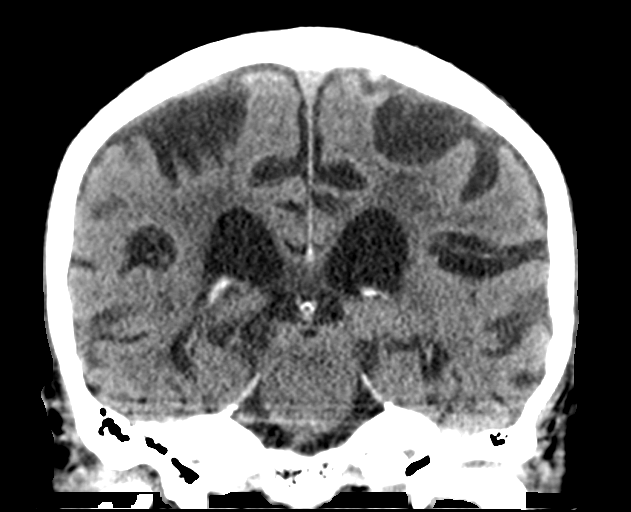
[im 37/67  brain]
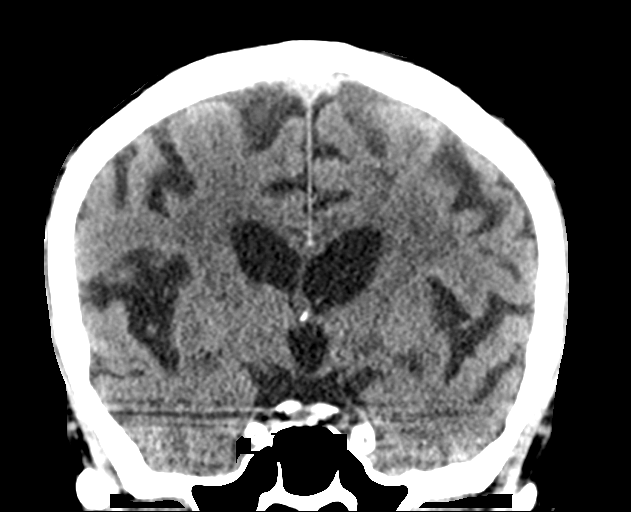

[Series 5: sagittal soft tissue · sagittal · 0.31mm/px · 3 of 56 slices shown]
[im 19/56  brain]
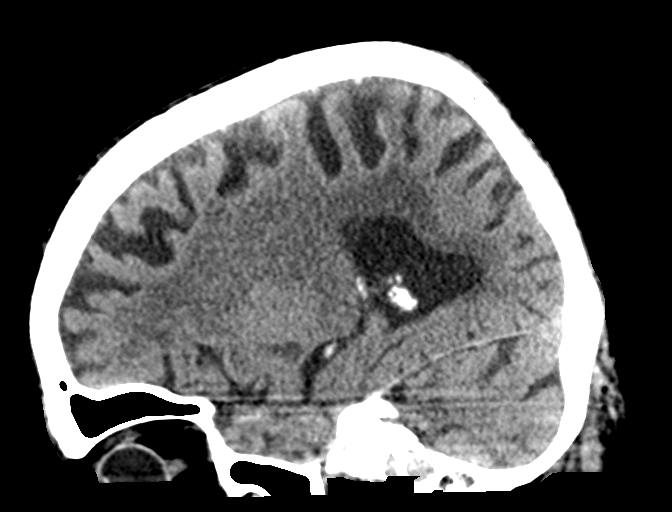
[im 28/56  brain]
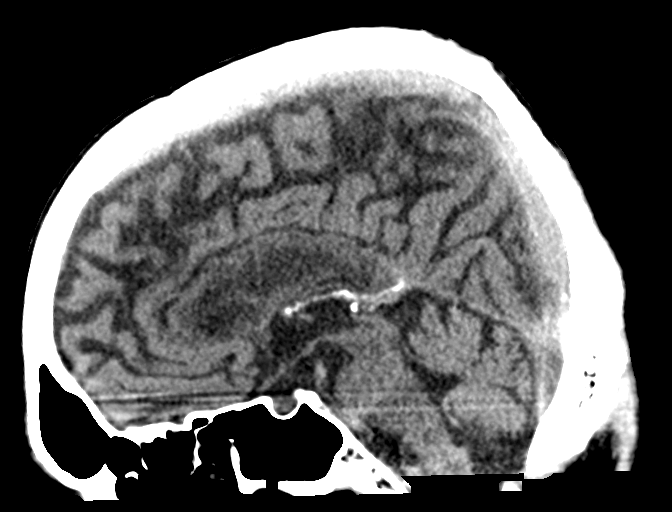
[im 37/56  brain]
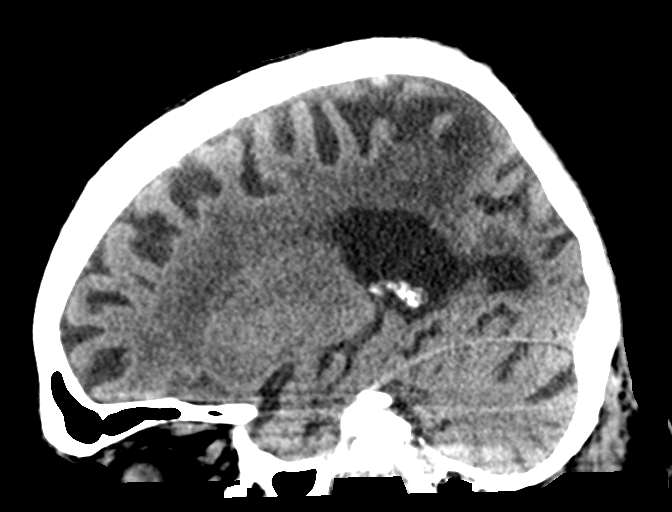

[15 of 47 positions shown; findings below may reference images not displayed]

FINDINGS: Brain: The ventricles are normal in configuration. There is
ventricular and sulcal enlargement reflecting moderate atrophy. No
hydrocephalus.

There are no parenchymal masses or mass effect. There is an old left
parietal infarct with mild encephalomalacia, stable from prior exam.
Patchy areas of white matter hypoattenuation are noted consistent
with mild chronic microvascular ischemic change.

There is no evidence of a recent infarct.

There are no extra-axial masses or abnormal fluid collections.

There is no intracranial hemorrhage.

Vascular: No hyperdense vessel or unexpected calcification.

Skull: Normal. Negative for fracture or focal lesion.

Sinuses/Orbits: Visualize globes and orbits are unremarkable.
Visualized sinuses show mild anterior ethmoid air cell mucosal
thickening and inferior frontal sinus mucosal thickening. Clear
mastoid air cells.

Other: Stable appearance from the prior study.
IMPRESSION: 1. No acute intracranial abnormalities.
2. Atrophy, old left parietal infarct and mild chronic microvascular
ischemic change.

## 2017-05-23 IMAGING — CR DG LUMBAR SPINE 2-3V
1 series · 3 of 3 positions shown · non-contrast
Comparison: None.

CLINICAL DATA: Pain following fall

EXAM:
LUMBAR SPINE - 2-3 VIEW

[Series 1: dg lumbar spine 2-3 views · 0.14mm/px · 3 of 3 slices shown]
[im 1/3]
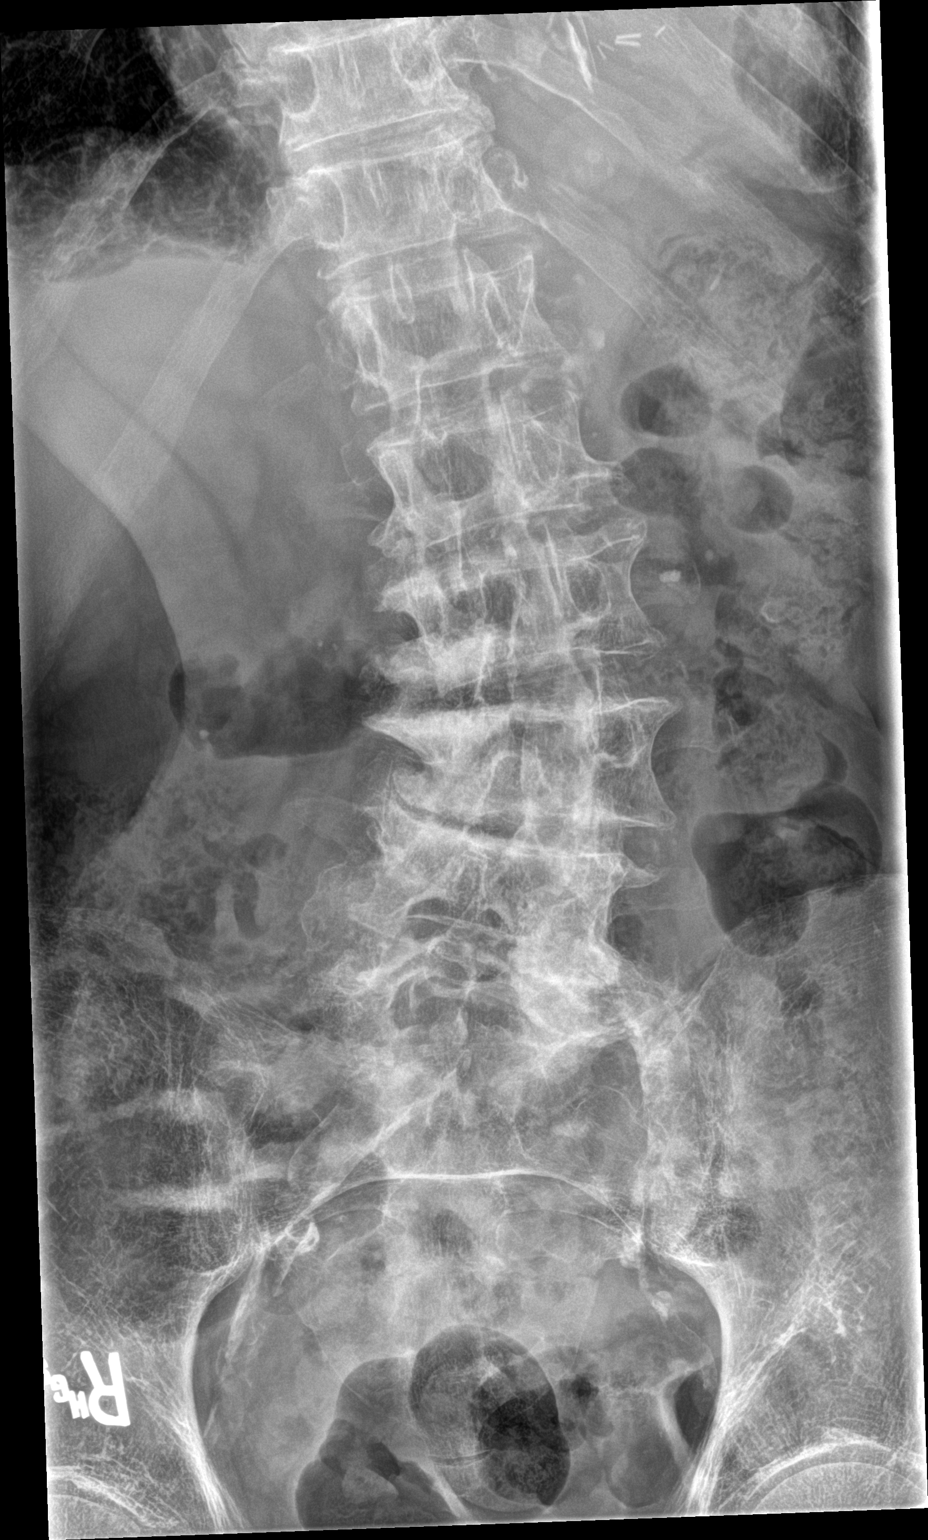
[im 2/3]
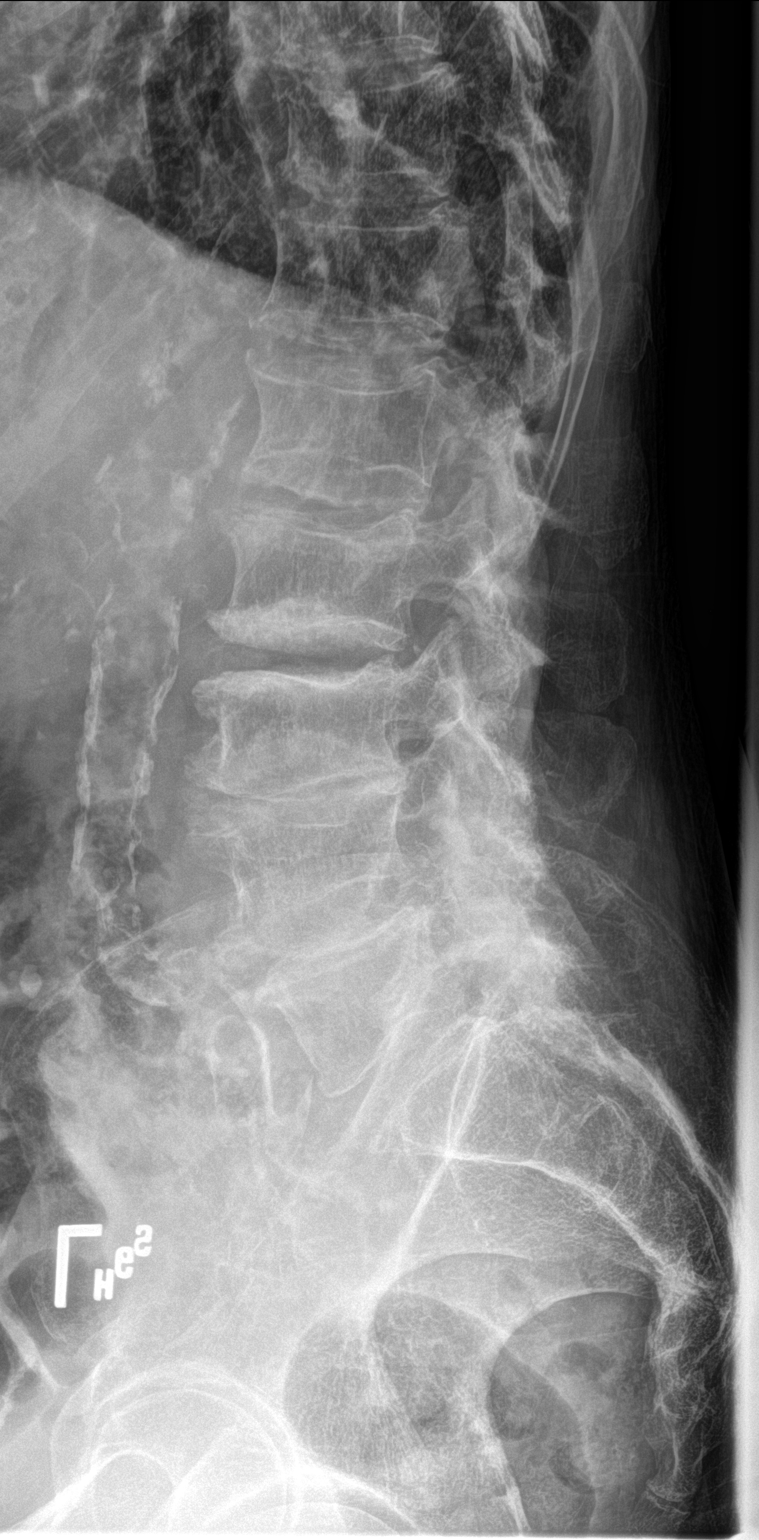
[im 3/3]
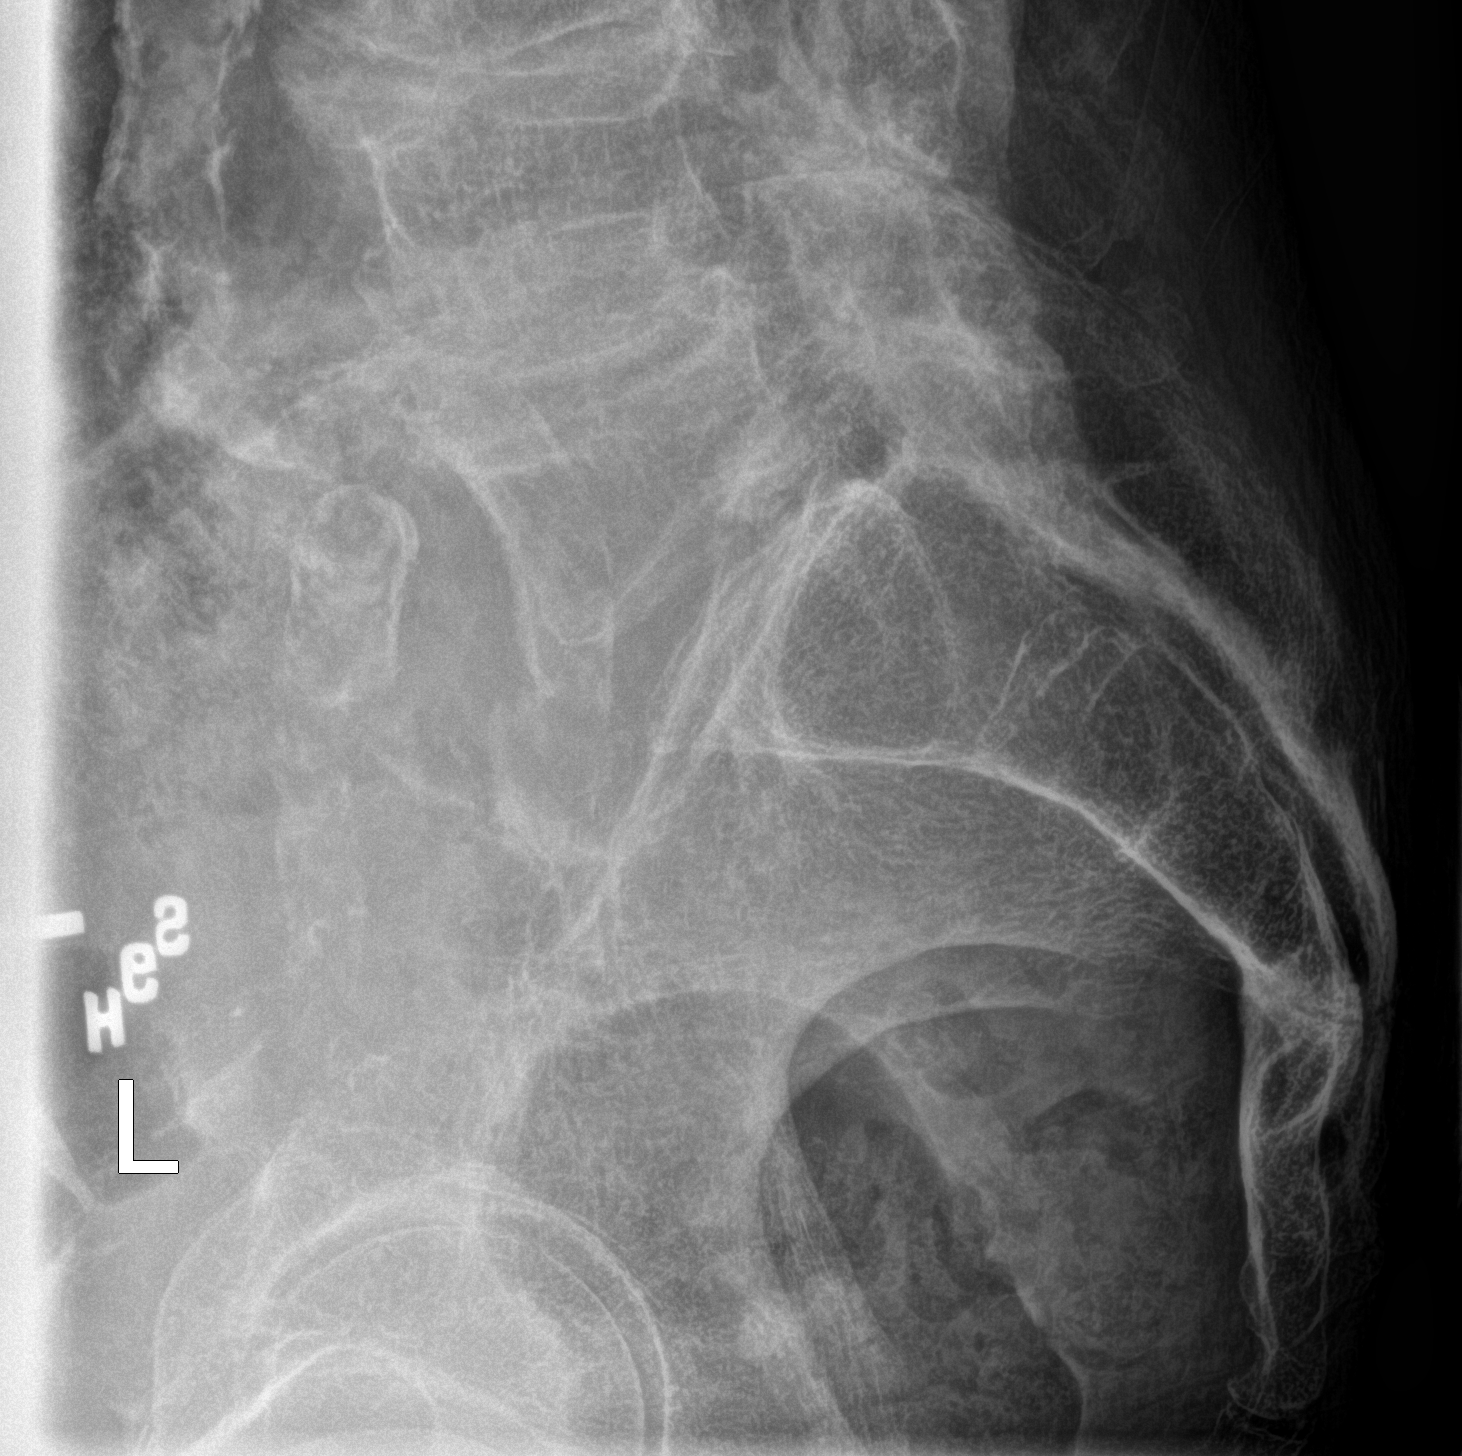

[3 of 3 positions shown; findings below may reference images not displayed]

FINDINGS: Frontal, lateral, and spot lumbosacral lateral images were obtained.
There are 5 non-rib-bearing lumbar type vertebral bodies. There is
lumbar levoscoliosis. There is no evidence of acute fracture or
spondylolisthesis. There is moderately severe disc space narrowing
at L3-4 and L4-5. There is mild disc space narrowing at other
levels. There are anterior osteophytes at all levels. Bones are
osteoporotic. There is extensive calcification in the aorta and
iliac arteries.
IMPRESSION: Scoliosis with multilevel arthropathy. No acute fracture or
spondylolisthesis evident. There is extensive aortoiliac
atherosclerosis.
